# Patient Record
Sex: Female | Born: 1935 | Race: White | Hispanic: No | Marital: Married | Smoking: Never smoker
Health system: Southern US, Community
[De-identification: ages and names within clinical notes are randomized; demographics above are authoritative.]

## PROBLEM LIST (undated history)

## (undated) DIAGNOSIS — R002 Palpitations: Secondary | ICD-10-CM

## (undated) DIAGNOSIS — I2510 Atherosclerotic heart disease of native coronary artery without angina pectoris: Secondary | ICD-10-CM

## (undated) DIAGNOSIS — M8580 Other specified disorders of bone density and structure, unspecified site: Secondary | ICD-10-CM

## (undated) DIAGNOSIS — Z85828 Personal history of other malignant neoplasm of skin: Secondary | ICD-10-CM

## (undated) DIAGNOSIS — K219 Gastro-esophageal reflux disease without esophagitis: Secondary | ICD-10-CM

## (undated) DIAGNOSIS — E7800 Pure hypercholesterolemia, unspecified: Secondary | ICD-10-CM

## (undated) DIAGNOSIS — I499 Cardiac arrhythmia, unspecified: Secondary | ICD-10-CM

## (undated) DIAGNOSIS — Z952 Presence of prosthetic heart valve: Secondary | ICD-10-CM

## (undated) DIAGNOSIS — C801 Malignant (primary) neoplasm, unspecified: Secondary | ICD-10-CM

## (undated) DIAGNOSIS — M810 Age-related osteoporosis without current pathological fracture: Secondary | ICD-10-CM

## (undated) DIAGNOSIS — N939 Abnormal uterine and vaginal bleeding, unspecified: Secondary | ICD-10-CM

## (undated) DIAGNOSIS — R600 Localized edema: Secondary | ICD-10-CM

## (undated) DIAGNOSIS — J45909 Unspecified asthma, uncomplicated: Secondary | ICD-10-CM

## (undated) DIAGNOSIS — I099 Rheumatic heart disease, unspecified: Secondary | ICD-10-CM

## (undated) DIAGNOSIS — I509 Heart failure, unspecified: Secondary | ICD-10-CM

## (undated) DIAGNOSIS — H9190 Unspecified hearing loss, unspecified ear: Secondary | ICD-10-CM

## (undated) DIAGNOSIS — I10 Essential (primary) hypertension: Secondary | ICD-10-CM

## (undated) HISTORY — PX: CERVICAL CONE BIOPSY: SUR198

## (undated) HISTORY — PX: OTHER SURGICAL HISTORY: SHX169

---
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
## 2000-09-29 HISTORY — PX: COLONOSCOPY: SHX174

## 2005-03-31 ENCOUNTER — Ambulatory Visit: Payer: Self-pay | Admitting: Nurse Practitioner

## 2005-09-11 ENCOUNTER — Ambulatory Visit: Payer: Self-pay

## 2006-03-31 ENCOUNTER — Ambulatory Visit: Payer: Self-pay | Admitting: Nurse Practitioner

## 2007-03-05 ENCOUNTER — Ambulatory Visit: Payer: Self-pay | Admitting: Nurse Practitioner

## 2007-05-25 ENCOUNTER — Ambulatory Visit: Payer: Self-pay | Admitting: Family Medicine

## 2008-05-25 ENCOUNTER — Ambulatory Visit: Payer: Self-pay | Admitting: Family Medicine

## 2009-06-12 ENCOUNTER — Ambulatory Visit: Payer: Self-pay | Admitting: Family Medicine

## 2010-06-20 ENCOUNTER — Ambulatory Visit: Payer: Self-pay | Admitting: Family Medicine

## 2011-07-22 ENCOUNTER — Ambulatory Visit: Payer: Self-pay | Admitting: Family Medicine

## 2011-10-07 ENCOUNTER — Ambulatory Visit: Payer: Self-pay | Admitting: Family Medicine

## 2011-11-19 DIAGNOSIS — I10 Essential (primary) hypertension: Secondary | ICD-10-CM | POA: Diagnosis present

## 2011-11-19 DIAGNOSIS — I4891 Unspecified atrial fibrillation: Secondary | ICD-10-CM | POA: Diagnosis present

## 2011-11-19 DIAGNOSIS — I2510 Atherosclerotic heart disease of native coronary artery without angina pectoris: Secondary | ICD-10-CM | POA: Diagnosis present

## 2012-07-22 ENCOUNTER — Ambulatory Visit: Payer: Self-pay | Admitting: Family Medicine

## 2013-07-25 ENCOUNTER — Ambulatory Visit: Payer: Self-pay | Admitting: Family Medicine

## 2014-07-26 ENCOUNTER — Ambulatory Visit: Payer: Self-pay

## 2015-04-10 ENCOUNTER — Encounter
Admission: RE | Admit: 2015-04-10 | Discharge: 2015-04-10 | Disposition: A | Discharge status: Home / Self Care | Payer: Medicare Other | Source: Ambulatory Visit | Attending: Ophthalmology | Admitting: Ophthalmology

## 2015-04-10 ENCOUNTER — Encounter: Payer: Self-pay | Admitting: *Deleted

## 2015-04-10 DIAGNOSIS — Z01812 Encounter for preprocedural laboratory examination: Secondary | ICD-10-CM | POA: Diagnosis present

## 2015-04-10 DIAGNOSIS — H2512 Age-related nuclear cataract, left eye: Secondary | ICD-10-CM | POA: Insufficient documentation

## 2015-04-10 DIAGNOSIS — I10 Essential (primary) hypertension: Secondary | ICD-10-CM | POA: Diagnosis not present

## 2015-04-10 DIAGNOSIS — C4490 Unspecified malignant neoplasm of skin, unspecified: Secondary | ICD-10-CM | POA: Insufficient documentation

## 2015-04-10 DIAGNOSIS — Z01810 Encounter for preprocedural cardiovascular examination: Secondary | ICD-10-CM | POA: Diagnosis present

## 2015-04-10 DIAGNOSIS — E780 Pure hypercholesterolemia: Secondary | ICD-10-CM | POA: Diagnosis not present

## 2015-04-10 DIAGNOSIS — Z952 Presence of prosthetic heart valve: Secondary | ICD-10-CM | POA: Diagnosis not present

## 2015-04-10 DIAGNOSIS — M1991 Primary osteoarthritis, unspecified site: Secondary | ICD-10-CM | POA: Insufficient documentation

## 2015-04-10 DIAGNOSIS — I499 Cardiac arrhythmia, unspecified: Secondary | ICD-10-CM | POA: Diagnosis not present

## 2015-04-10 LAB — POTASSIUM: POTASSIUM: 4.2 mmol/L (ref 3.5–5.1)

## 2015-04-11 NOTE — Pre-Procedure Instructions (Signed)
EKG sent to anesthesia for review.  Reviewed by Dr. Collie Siad, marked OK.

## 2015-04-16 ENCOUNTER — Ambulatory Visit
Admission: RE | Admit: 2015-04-16 | Discharge: 2015-04-16 | Disposition: A | Discharge status: Home / Self Care | Payer: Medicare Other | Source: Ambulatory Visit | Attending: Ophthalmology | Admitting: Ophthalmology

## 2015-04-16 ENCOUNTER — Encounter: Payer: Self-pay | Admitting: Anesthesiology

## 2015-04-16 ENCOUNTER — Encounter
Admission: RE | Disposition: A | Discharge status: Home / Self Care | Payer: Self-pay | Source: Ambulatory Visit | Attending: Ophthalmology

## 2015-04-16 ENCOUNTER — Ambulatory Visit: Payer: Medicare Other | Admitting: Anesthesiology

## 2015-04-16 DIAGNOSIS — H9190 Unspecified hearing loss, unspecified ear: Secondary | ICD-10-CM | POA: Diagnosis not present

## 2015-04-16 DIAGNOSIS — J45909 Unspecified asthma, uncomplicated: Secondary | ICD-10-CM | POA: Diagnosis not present

## 2015-04-16 DIAGNOSIS — I10 Essential (primary) hypertension: Secondary | ICD-10-CM | POA: Diagnosis not present

## 2015-04-16 DIAGNOSIS — M810 Age-related osteoporosis without current pathological fracture: Secondary | ICD-10-CM | POA: Insufficient documentation

## 2015-04-16 DIAGNOSIS — H2512 Age-related nuclear cataract, left eye: Secondary | ICD-10-CM | POA: Diagnosis present

## 2015-04-16 DIAGNOSIS — E780 Pure hypercholesterolemia: Secondary | ICD-10-CM | POA: Diagnosis not present

## 2015-04-16 DIAGNOSIS — Z952 Presence of prosthetic heart valve: Secondary | ICD-10-CM | POA: Diagnosis not present

## 2015-04-16 DIAGNOSIS — K219 Gastro-esophageal reflux disease without esophagitis: Secondary | ICD-10-CM | POA: Diagnosis not present

## 2015-04-16 HISTORY — DX: Essential (primary) hypertension: I10

## 2015-04-16 HISTORY — DX: Unspecified hearing loss, unspecified ear: H91.90

## 2015-04-16 HISTORY — PX: CATARACT EXTRACTION W/PHACO: SHX586

## 2015-04-16 HISTORY — DX: Unspecified asthma, uncomplicated: J45.909

## 2015-04-16 HISTORY — DX: Pure hypercholesterolemia, unspecified: E78.00

## 2015-04-16 HISTORY — DX: Presence of prosthetic heart valve: Z95.2

## 2015-04-16 HISTORY — DX: Malignant (primary) neoplasm, unspecified: C80.1

## 2015-04-16 HISTORY — DX: Cardiac arrhythmia, unspecified: I49.9

## 2015-04-16 HISTORY — DX: Localized edema: R60.0

## 2015-04-16 HISTORY — DX: Gastro-esophageal reflux disease without esophagitis: K21.9

## 2015-04-16 HISTORY — DX: Abnormal uterine and vaginal bleeding, unspecified: N93.9

## 2015-04-16 HISTORY — DX: Age-related osteoporosis without current pathological fracture: M81.0

## 2015-04-16 HISTORY — DX: Palpitations: R00.2

## 2015-04-16 LAB — PROTIME-INR
INR: 2.57
Prothrombin Time: 27.7 seconds — ABNORMAL HIGH (ref 11.4–15.0)

## 2015-04-16 SURGERY — PHACOEMULSIFICATION, CATARACT, WITH IOL INSERTION
Anesthesia: Monitor Anesthesia Care | Site: Eye | Laterality: Left | Wound class: Clean

## 2015-04-16 MED ORDER — PHENYLEPHRINE HCL 10 % OP SOLN
OPHTHALMIC | Status: AC
  Administered 2015-04-16: 1 [drp] via OPHTHALMIC
  Filled 2015-04-16: qty 5

## 2015-04-16 MED ORDER — LACTATED RINGERS IV SOLN: INTRAVENOUS | Status: DC

## 2015-04-16 MED ORDER — NA CHONDROIT SULF-NA HYALURON 40-17 MG/ML IO SOLN
INTRAOCULAR | Status: DC | PRN
  Administered 2015-04-16: 1 mL via INTRAOCULAR

## 2015-04-16 MED ORDER — LIDOCAINE HCL (PF) 4 % IJ SOLN
INTRAMUSCULAR | Status: DC | PRN
  Administered 2015-04-16: 5 mL via OPHTHALMIC

## 2015-04-16 MED ORDER — EPINEPHRINE HCL 1 MG/ML IJ SOLN
INTRAMUSCULAR | Status: AC
  Filled 2015-04-16: qty 2

## 2015-04-16 MED ORDER — NA CHONDROIT SULF-NA HYALURON 40-17 MG/ML IO SOLN
INTRAOCULAR | Status: AC
  Filled 2015-04-16: qty 1

## 2015-04-16 MED ORDER — PROPOFOL INFUSION 10 MG/ML OPTIME: INTRAVENOUS | Status: DC | PRN

## 2015-04-16 MED ORDER — TETRACAINE HCL 0.5 % OP SOLN
OPHTHALMIC | Status: AC
  Filled 2015-04-16: qty 2

## 2015-04-16 MED ORDER — CYCLOPENTOLATE HCL 2 % OP SOLN
1.0000 [drp] | OPHTHALMIC | Status: AC
  Administered 2015-04-16 (×4): 1 [drp] via OPHTHALMIC

## 2015-04-16 MED ORDER — LIDOCAINE HCL (PF) 1 % IJ SOLN
INTRAOCULAR | Status: DC | PRN
  Administered 2015-04-16: 4 mL via OPHTHALMIC

## 2015-04-16 MED ORDER — CEFUROXIME OPHTHALMIC INJECTION 1 MG/0.1 ML
INJECTION | OPHTHALMIC | Status: AC
  Filled 2015-04-16: qty 0.1

## 2015-04-16 MED ORDER — ALFENTANIL 500 MCG/ML IJ INJ
INJECTION | INTRAMUSCULAR | Status: DC | PRN
  Administered 2015-04-16: 1000 ug via INTRAVENOUS

## 2015-04-16 MED ORDER — CEFUROXIME OPHTHALMIC INJECTION 1 MG/0.1 ML
INJECTION | OPHTHALMIC | Status: DC | PRN
  Administered 2015-04-16: 1 mg via INTRACAMERAL

## 2015-04-16 MED ORDER — CYCLOPENTOLATE HCL 2 % OP SOLN
OPHTHALMIC | Status: AC
  Administered 2015-04-16: 1 [drp] via OPHTHALMIC
  Filled 2015-04-16: qty 2

## 2015-04-16 MED ORDER — SODIUM CHLORIDE 0.9 % IV SOLN
INTRAVENOUS | Status: DC
  Administered 2015-04-16: 13:00:00 via INTRAVENOUS

## 2015-04-16 MED ORDER — EPINEPHRINE HCL 1 MG/ML IJ SOLN
INTRAOCULAR | Status: DC | PRN
  Administered 2015-04-16: 200 mL

## 2015-04-16 MED ORDER — TETRACAINE HCL 0.5 % OP SOLN
OPHTHALMIC | Status: DC | PRN
  Administered 2015-04-16: 1 [drp] via OPHTHALMIC

## 2015-04-16 MED ORDER — CARBACHOL 0.01 % IO SOLN
INTRAOCULAR | Status: DC | PRN
  Administered 2015-04-16: 0.5 mL via INTRAOCULAR

## 2015-04-16 MED ORDER — MOXIFLOXACIN HCL 0.5 % OP SOLN
1.0000 [drp] | OPHTHALMIC | Status: AC
  Administered 2015-04-16 (×3): 1 [drp] via OPHTHALMIC

## 2015-04-16 MED ORDER — HYALURONIDASE HUMAN 150 UNIT/ML IJ SOLN
INTRAMUSCULAR | Status: AC
  Filled 2015-04-16: qty 1

## 2015-04-16 MED ORDER — PHENYLEPHRINE HCL 10 % OP SOLN
1.0000 [drp] | OPHTHALMIC | Status: AC
Start: 2015-04-16 — End: 2015-04-16
  Administered 2015-04-16 (×4): 1 [drp] via OPHTHALMIC

## 2015-04-16 MED ORDER — MOXIFLOXACIN HCL 0.5 % OP SOLN
OPHTHALMIC | Status: DC | PRN
  Administered 2015-04-16: 1 [drp] via OPHTHALMIC

## 2015-04-16 MED ORDER — PROPOFOL 10 MG/ML IV BOLUS
INTRAVENOUS | Status: DC | PRN
  Administered 2015-04-16 (×2): 50 mg via INTRAVENOUS

## 2015-04-16 MED ORDER — BUPIVACAINE HCL (PF) 0.75 % IJ SOLN
INTRAMUSCULAR | Status: AC
  Filled 2015-04-16: qty 10

## 2015-04-16 MED ORDER — MOXIFLOXACIN HCL 0.5 % OP SOLN
OPHTHALMIC | Status: AC
  Administered 2015-04-16: 1 [drp] via OPHTHALMIC
  Filled 2015-04-16: qty 3

## 2015-04-16 MED ORDER — LIDOCAINE HCL (PF) 4 % IJ SOLN
INTRAMUSCULAR | Status: AC
  Filled 2015-04-16: qty 5

## 2015-04-16 SURGICAL SUPPLY — 25 items
CORD BIP STRL DISP 12FT (MISCELLANEOUS) ×3 IMPLANT
DRAPE XRAY CASSETTE 23X24 (DRAPES) ×3 IMPLANT
ERASER HMR WETFIELD 18G (MISCELLANEOUS) ×3 IMPLANT
GLOVE BIO SURGEON STRL SZ8 (GLOVE) ×3 IMPLANT
GLOVE SURG LX 6.5 MICRO (GLOVE) ×2
GLOVE SURG LX 8.0 MICRO (GLOVE) ×2
GLOVE SURG LX STRL 6.5 MICRO (GLOVE) ×1 IMPLANT
GLOVE SURG LX STRL 8.0 MICRO (GLOVE) ×1 IMPLANT
GOWN STRL REUS W/ TWL LRG LVL3 (GOWN DISPOSABLE) ×1 IMPLANT
GOWN STRL REUS W/ TWL XL LVL3 (GOWN DISPOSABLE) ×1 IMPLANT
GOWN STRL REUS W/TWL LRG LVL3 (GOWN DISPOSABLE) ×2
GOWN STRL REUS W/TWL XL LVL3 (GOWN DISPOSABLE) ×2
LENS IOL ACRYSOF IQ 20.5 (Intraocular Lens) ×3 IMPLANT
PACK CATARACT (MISCELLANEOUS) ×3 IMPLANT
PACK CATARACT DINGLEDEIN LX (MISCELLANEOUS) ×3 IMPLANT
PACK EYE AFTER SURG (MISCELLANEOUS) ×3 IMPLANT
SHLD EYE VISITEC  UNIV (MISCELLANEOUS) ×3 IMPLANT
SOL PREP PVP 2OZ (MISCELLANEOUS) ×3
SOLUTION PREP PVP 2OZ (MISCELLANEOUS) ×1 IMPLANT
SUT SILK 5-0 (SUTURE) ×3 IMPLANT
SYR 5ML LL (SYRINGE) ×3 IMPLANT
SYR TB 1ML 27GX1/2 LL (SYRINGE) ×3 IMPLANT
UltraSERT ×3 IMPLANT
WATER STERILE IRR 1000ML POUR (IV SOLUTION) ×3 IMPLANT
WIPE NON LINTING 3.25X3.25 (MISCELLANEOUS) ×3 IMPLANT

## 2015-04-16 NOTE — Anesthesia Postprocedure Evaluation (Signed)
  Anesthesia Post-op Note  Patient: Megan Becker  Procedure(s) Performed: Procedure(s) with comments: CATARACT EXTRACTION PHACO AND INTRAOCULAR LENS PLACEMENT (IOC) (Left) - [place]: 00:58 AP:22.7 CDE:23.44 PACK LOT: [id number]  Anesthesia type:MAC  Patient location: PACU  Post pain: Pain level controlled  Post assessment: Post-op Vital signs reviewed, Patient's Cardiovascular Status Stable, Respiratory Function Stable, Patent Airway and No signs of Nausea or vomiting  Post vital signs: Reviewed and stable  Last Vitals:  Filed Vitals:   04/16/15 1422  BP: 129/54  Pulse: 79  Temp:   Resp: 16    Level of consciousness: awake, alert  and patient cooperative  Complications: No apparent anesthesia complications

## 2015-04-16 NOTE — Anesthesia Preprocedure Evaluation (Signed)
Anesthesia Evaluation  Patient identified by MRN, date of birth, ID band Patient awake    Reviewed: Allergy & Precautions, H&P , NPO status , Patient's Chart, lab work & pertinent test results, reviewed documented beta blocker date and time   Airway Mallampati: III  TM Distance: >3 FB Neck ROM: limited    Dental no notable dental hx. (+) Teeth Intact, Caps   Pulmonary asthma ,  breath sounds clear to auscultation  Pulmonary exam normal       Cardiovascular Exercise Tolerance: Good hypertension, Normal cardiovascular exam+ dysrhythmias Rhythm:regular Rate:Normal  Mechanical Mitral Valve   Neuro/Psych negative neurological ROS  negative psych ROS   GI/Hepatic Neg liver ROS, GERD-  Controlled,  Endo/Other  negative endocrine ROS  Renal/GU negative Renal ROS  negative genitourinary   Musculoskeletal   Abdominal   Peds  Hematology negative hematology ROS (+)   Anesthesia Other Findings Past Medical History:   Asthma                                                       Hypertension                                                 Mitral valve replaced                                        Arrhythmia                                                   GERD (gastroesophageal reflux disease)                       HOH (hard of hearing)                                        Hypercholesteremia                                           Osteoporosis                                                 Cancer                                                         Comment:skin   Vaginal bleeding  Palpitations                                                 Lower extremity edema                                        Reproductive/Obstetrics negative OB ROS                             Anesthesia Physical Anesthesia Plan  ASA: III  Anesthesia Plan: MAC    Post-op Pain Management:    Induction:   Airway Management Planned:   Additional Equipment:   Intra-op Plan:   Post-operative Plan:   Informed Consent: I have reviewed the patients History and Physical, chart, labs and discussed the procedure including the risks, benefits and alternatives for the proposed anesthesia with the patient or authorized representative who has indicated his/her understanding and acceptance.   Dental Advisory Given  Plan Discussed with: Anesthesiologist, CRNA and Surgeon  Anesthesia Plan Comments:         Anesthesia Quick Evaluation

## 2015-04-16 NOTE — H&P (Signed)
  History and physical was faxed and scanned in.   

## 2015-04-16 NOTE — Op Note (Signed)
Date of Surgery: 04/16/2015 Date of Dictation: 04/16/2015 1:55 PM Pre-operative Diagnosis:  Nuclear Sclerotic Cataract left Eye Post-operative Diagnosis: same Procedure performed: Extra-capsular Cataract Extraction (ECCE) with placement of a posterior chamber intraocular lens (IOL) left Eye IOL:  Implant Name Type Inv. Item Serial No. Manufacturer Lot No. LRB No. Used  Jearld Lesch     [id number] ALCON   Left 1   Anesthesia: 2% Lidocaine and 4% Marcaine in a 50/50 mixture with 10 unites/ml of Hylenex given as a peribulbar Anesthesiologist: Anesthesiologist: Andria Frames, MD CRNA: Sinda Du, CRNA Complications: none Estimated Blood Loss: less than 1 ml  Description of procedure:  The patient was given anesthesia and sedation via intravenous access. The patient was then prepped and draped in the usual fashion. A 25-gauge needle was bent for initiating the capsulorhexis. A 5-0 silk suture was placed through the conjunctiva superior and inferiorly to serve as bridle sutures. Hemostasis was obtained at the superior limbus using an eraser cautery. A partial thickness groove was made at the anterior surgical limbus with a 64 Beaver blade and this was dissected anteriorly with an [employer]aya. The anterior chamber was entered at 10 o'clock with a 1.0 mm paracentesis knife and through the lamellar dissection with a 2.6 mm Alcon keratome. Epi-Shugarcaine 0.5 CC [9 cc BSS Plus (Alcon), 3 cc 4% preservative-free lidocaine (Hospira) and 4 cc 1:1000 preservative-free, bisulfite-free epinephrine] was injected via the paracentesis tract. DiscoVisc was injected to replace the aqueous and a continuous tear curvilinear capsulorhexis was performed using a bent 25-gauge needle.  Balance salt on a syringe was used to perform hydro-dissection and phacoemulsification was carried out using a divide and conquer technique. Procedure(s) with comments: CATARACT EXTRACTION PHACO AND INTRAOCULAR LENS PLACEMENT (IOC)  (Left) - [place]: 00:58 AP:22.7 CDE:23.44 PACK LOT: [id number] H. Irrigation/aspiration was used to remove the residual cortex and the capsular bag was inflated with DiscoVisc. The intraocular lens was inserted into the capsular bag using a pre-loaded UltraSert Delivery System. Irrigation/aspiration was used to remove the residual DiscoVisc. The wound was inflated with balanced salt and checked for leaks. None were found. Miostat was injected via the paracentesis track and 0.1 ml of cefuroxime containing 1 mg of drug  was injected via the paracentesis track. The wound was checked for leaks again and none were found.   The bridal sutures were removed and two drops of Vigamox were placed on the eye. An eye shield was placed to protect the eye and the patient was discharged to the recovery area in good condition.   Ondine Gemme MD

## 2015-04-16 NOTE — Interval H&P Note (Signed)
History and Physical Interval Note:  04/16/2015 1:02 PM  [place]  has presented today for surgery, with the diagnosis of CATARACT  The various methods of treatment have been discussed with the patient and family. After consideration of risks, benefits and other options for treatment, the patient has consented to  Procedure(s): CATARACT EXTRACTION PHACO AND INTRAOCULAR LENS PLACEMENT ([place]) (Left) as a surgical intervention .  The patient's history has been reviewed, patient examined, no change in status, stable for surgery.  I have reviewed the patient's chart and labs.  Questions were answered to the patient's satisfaction.     Sable Knoles

## 2015-04-16 NOTE — Transfer of Care (Signed)
Immediate Anesthesia Transfer of Care Note  Patient: Megan Becker  Procedure(s) Performed: Procedure(s) with comments: CATARACT EXTRACTION PHACO AND INTRAOCULAR LENS PLACEMENT (IOC) (Left) - [place]: 00:58 AP:22.7 CDE:23.44 PACK LOT: [id number] H  Patient Location: PACU  Anesthesia Type:MAC  Level of Consciousness: awake  Airway & Oxygen Therapy: Patient Spontanous Breathing  Post-op Assessment: Report given to RN  Post vital signs: Reviewed  Last Vitals:  Filed Vitals:   04/16/15 1223  BP: 136/87  Pulse: 86  Temp: 36.7 C  Resp: 16    Complications: No apparent anesthesia complications

## 2015-04-16 NOTE — Discharge Instructions (Addendum)
General Anesthesia, Care After Refer to this sheet in the next few weeks. These instructions provide you with information on caring for yourself after your procedure. Your health care provider may also give you more specific instructions. Your treatment has been planned according to current medical practices, but problems sometimes occur. Call your health care provider if you have any problems or questions after your procedure. WHAT TO EXPECT AFTER THE PROCEDURE After the procedure, it is typical to experience:  Sleepiness.  Nausea and vomiting. HOME CARE INSTRUCTIONS  For the first 24 hours after general anesthesia:  Have a responsible person with you.  Do not drive a car. If you are alone, do not take public transportation.  Do not drink alcohol.  Do not take medicine that has not been prescribed by your health care provider.  Do not sign important papers or make important decisions.  You may resume a normal diet and activities as directed by your health care provider.  Change bandages (dressings) as directed.  If you have questions or problems that seem related to general anesthesia, call the hospital and ask for the anesthetist or anesthesiologist on call. SEEK MEDICAL CARE IF:  You have nausea and vomiting that continue the day after anesthesia.  You develop a rash. SEEK IMMEDIATE MEDICAL CARE IF:   You have difficulty breathing.  You have chest pain.  You have any allergic problems. Document Released: 12/22/2000 Document Revised: 09/20/2013 Document Reviewed: 03/31/2013 [facility] Patient Information 2015 [place], [place]. This information is not intended to replace advice given to you by your health care provider. Make sure you discuss any questions you have with your health care provider.  Cataract Surgery Care After Refer to this sheet in the next few weeks. These instructions provide you with information on caring for yourself after your procedure. Your caregiver  may also give you more specific instructions. Your treatment has been planned according to current medical practices, but problems sometimes occur. Call your caregiver if you have any problems or questions after your procedure.  HOME CARE INSTRUCTIONS   Avoid strenuous activities as directed by your caregiver.  Ask your caregiver when you can resume driving.  Use eyedrops or other medicines to help healing and control pressure inside your eye as directed by your caregiver.  Only take over-the-counter or prescription medicines for pain, discomfort, or fever as directed by your caregiver.  Do not to touch or rub your eyes.  You may be instructed to use a protective shield during the first few days and nights after surgery. If not, wear sunglasses to protect your eyes. This is to protect the eye from pressure or from being accidentally bumped.  Keep the area around your eye clean and dry. Avoid swimming or allowing water to hit you directly in the face while showering. Keep soap and shampoo out of your eyes.  Do not bend or lift heavy objects. Bending increases pressure in the eye. You can walk, climb stairs, and do light household chores.  Do not put a contact lens into the eye that had surgery until your caregiver says it is okay to do so.  Ask your doctor when you can return to work. This will depend on the kind of work that you do. If you work in a dusty environment, you may be advised to wear protective eyewear for a period of time.  Ask your caregiver when it will be safe to engage in sexual activity.  Continue with your regular eye exams as directed  by your caregiver. What to expect:  It is normal to feel itching and mild discomfort for a few days after cataract surgery. Some fluid discharge is also common, and your eye may be sensitive to light and touch.  After 1 to 2 days, even moderate discomfort should disappear. In most cases, healing will take about 6 weeks.  If you  received an intraocular lens (IOL), you may notice that colors are very bright or have a blue tinge. Also, if you have been in bright sunlight, everything may appear reddish for a few hours. If you see these color tinges, it is because your lens is clear and no longer cloudy. Within a few months after receiving an IOL, these extra colors should go away. When you have healed, you will probably need new glasses. SEEK MEDICAL CARE IF:   You have increased bruising around your eye.  You have discomfort not helped by medicine. SEEK IMMEDIATE MEDICAL CARE IF:   You have a fever.  You have a worsening or sudden vision loss.  You have redness, swelling, or increasing pain in the eye.  You have a thick discharge from the eye that had surgery. MAKE SURE YOU:  Understand these instructions.  Will watch your condition.  Will get help right away if you are not doing well or get worse. Document Released: 04/04/2005 Document Revised: 12/08/2011 Document Reviewed: 05/09/2011 [facility] Patient Information 2015 [place], [place]. This information is not intended to replace advice given to you by your health care provider. Make sure you discuss any questions you have with your health care provider.

## 2016-07-30 DIAGNOSIS — Z85828 Personal history of other malignant neoplasm of skin: Secondary | ICD-10-CM

## 2016-07-30 HISTORY — DX: Personal history of other malignant neoplasm of skin: Z85.828

## 2019-06-28 ENCOUNTER — Other Ambulatory Visit: Payer: Self-pay | Admitting: Family Medicine

## 2019-06-28 DIAGNOSIS — M810 Age-related osteoporosis without current pathological fracture: Secondary | ICD-10-CM

## 2019-07-11 ENCOUNTER — Other Ambulatory Visit: Payer: Self-pay | Admitting: Family Medicine

## 2019-07-11 DIAGNOSIS — Z1231 Encounter for screening mammogram for malignant neoplasm of breast: Secondary | ICD-10-CM

## 2019-07-14 ENCOUNTER — Emergency Department
Admission: EM | Admit: 2019-07-14 | Discharge: 2019-07-14 | Disposition: A | Discharge status: Home / Self Care | Payer: Medicare HMO | Attending: Emergency Medicine | Admitting: Emergency Medicine

## 2019-07-14 ENCOUNTER — Encounter: Payer: Self-pay | Admitting: Emergency Medicine

## 2019-07-14 ENCOUNTER — Other Ambulatory Visit: Payer: Self-pay

## 2019-07-14 DIAGNOSIS — Z79899 Other long term (current) drug therapy: Secondary | ICD-10-CM | POA: Diagnosis not present

## 2019-07-14 DIAGNOSIS — Z7982 Long term (current) use of aspirin: Secondary | ICD-10-CM | POA: Diagnosis not present

## 2019-07-14 DIAGNOSIS — Z952 Presence of prosthetic heart valve: Secondary | ICD-10-CM | POA: Insufficient documentation

## 2019-07-14 DIAGNOSIS — I10 Essential (primary) hypertension: Secondary | ICD-10-CM | POA: Insufficient documentation

## 2019-07-14 DIAGNOSIS — R040 Epistaxis: Secondary | ICD-10-CM | POA: Diagnosis not present

## 2019-07-14 DIAGNOSIS — J45909 Unspecified asthma, uncomplicated: Secondary | ICD-10-CM | POA: Insufficient documentation

## 2019-07-14 DIAGNOSIS — Z7901 Long term (current) use of anticoagulants: Secondary | ICD-10-CM | POA: Diagnosis not present

## 2019-07-14 LAB — BASIC METABOLIC PANEL
Anion gap: 11 (ref 5–15)
BUN: 22 mg/dL (ref 8–23)
CO2: 28 mmol/L (ref 22–32)
Calcium: 8.6 mg/dL — ABNORMAL LOW (ref 8.9–10.3)
Chloride: 102 mmol/L (ref 98–111)
Creatinine, Ser: 0.8 mg/dL (ref 0.44–1.00)
GFR calc Af Amer: >60 mL/min (ref >60)
GFR calc non Af Amer: >60 mL/min (ref >60)
Glucose, Bld: 120 mg/dL — ABNORMAL HIGH (ref 70–99)
Potassium: 2.9 mmol/L — ABNORMAL LOW (ref 3.5–5.1)
Sodium: 141 mmol/L (ref 135–145)

## 2019-07-14 LAB — CBC WITH DIFFERENTIAL/PLATELET
Abs Immature Granulocytes: 0.02 10*3/uL (ref 0.00–0.07)
Basophils Absolute: 0.1 10*3/uL (ref 0.0–0.1)
Basophils Relative: 1 %
Eosinophils Absolute: 0.2 10*3/uL (ref 0.0–0.5)
Eosinophils Relative: 2 %
HCT: 43.5 % (ref 36.0–46.0)
Hemoglobin: 14.3 g/dL (ref 12.0–15.0)
Immature Granulocytes: 0 %
Lymphocytes Relative: 23 %
Lymphs Abs: 1.6 10*3/uL (ref 0.7–4.0)
MCH: 32.4 pg (ref 26.0–34.0)
MCHC: 32.9 g/dL (ref 30.0–36.0)
MCV: 98.6 fL (ref 80.0–100.0)
Monocytes Absolute: 1.1 10*3/uL — ABNORMAL HIGH (ref 0.1–1.0)
Monocytes Relative: 15 %
Neutro Abs: 4.1 10*3/uL (ref 1.7–7.7)
Neutrophils Relative %: 59 %
Platelets: 181 10*3/uL (ref 150–400)
RBC: 4.41 MIL/uL (ref 3.87–5.11)
RDW: 11.7 % (ref 11.5–15.5)
WBC: 7 10*3/uL (ref 4.0–10.5)
nRBC: 0 % (ref 0.0–0.2)

## 2019-07-14 LAB — PROTIME-INR
INR: 2.9 — ABNORMAL HIGH (ref 0.8–1.2)
Prothrombin Time: 29.8 seconds — ABNORMAL HIGH (ref 11.4–15.2)

## 2019-07-14 MED ORDER — TRANEXAMIC ACID 1000 MG/10ML IV SOLN
500.0000 mg | Freq: Once | INTRAVENOUS | Status: AC
  Administered 2019-07-14: 500 mg via TOPICAL
  Filled 2019-07-14 (×2): qty 10

## 2019-07-14 MED ORDER — POTASSIUM CHLORIDE ER 10 MEQ PO TBCR: 10.0000 meq | EXTENDED_RELEASE_TABLET | Freq: Every day | ORAL | 0 refills | Status: DC

## 2019-07-14 MED ORDER — AMOXICILLIN-POT CLAVULANATE 875-125 MG PO TABS: 1.0000 | ORAL_TABLET | Freq: Two times a day (BID) | ORAL | 0 refills | Status: AC

## 2019-07-14 MED ORDER — SODIUM CHLORIDE 0.9 % IV BOLUS
1000.0000 mL | Freq: Once | INTRAVENOUS | Status: AC
  Administered 2019-07-14: 1000 mL via INTRAVENOUS

## 2019-07-14 MED ORDER — POTASSIUM CHLORIDE 20 MEQ/15ML (10%) PO SOLN
40.0000 meq | Freq: Once | ORAL | Status: AC
  Administered 2019-07-14: 40 meq via ORAL
  Filled 2019-07-14: qty 30

## 2019-07-14 NOTE — ED Notes (Signed)
Called Pharmacy and they will send up medications

## 2019-07-14 NOTE — ED Provider Notes (Addendum)
[facility] Emergency Department Provider Note   ____________________________________________   First MD Initiated Contact with Patient 07/14/19 [phone]     (approximate)  I have reviewed the triage vital signs and the nursing notes.   HISTORY  Chief Complaint Epistaxis    HPI Megan Becker is a 84 y.o. female with past medical history of chronic atrial fibrillation and mitral valve replacement on Coumadin presents to the ED complaining of epistaxis.  Patient reports that she noticed bleeding coming from both sides of her nose shortly after waking up this morning.  She was unable to control bleeding with pressure at home and states she had blood coming out of front of her nose as well as down the back of her throat.  EMS was called and applied Afrin with no improvement in bleeding.  Patient states she has not had issues with nosebleeds in the past, states her INR was checked yesterday but she is not sure what it was.  She denies any lightheadedness, chest pain, or shortness of breath.        Past Medical History:  Diagnosis Date  . Arrhythmia   . Asthma   . Cancer ([place])    skin  . GERD (gastroesophageal reflux disease)   . HOH (hard of hearing)   . Hx of basal cell carcinoma 07/2016   R zygoma at hair line, R post. auricular neck  . Hypercholesteremia   . Hypertension   . Lower extremity edema   . Mitral valve replaced   . Osteoporosis   . Palpitations   . Vaginal bleeding     There are no active problems to display for this patient.   Past Surgical History:  Procedure Laterality Date  . CATARACT EXTRACTION W/PHACO Left 04/16/2015   Procedure: CATARACT EXTRACTION PHACO AND INTRAOCULAR LENS PLACEMENT (IOC);  Surgeon: Estill Cotta, MD;  Location: [facility] ORS;  Service: Ophthalmology;  Laterality: Left;  [place]: 00:58 AP:22.7 CDE:23.44 PACK LOT: [id number] H  . CERVICAL CONE BIOPSY    . mitral valve replaced      Prior to Admission medications    Medication Sig Start Date End Date Taking? Authorizing Provider  amLODipine (NORVASC) 5 MG tablet Take 5 mg by mouth daily.    Yes [provider]  amoxicillin (AMOXIL) 500 MG tablet Take 2,000 mg by mouth as directed. (1 hour before dental procedures)   Yes [provider]  aspirin 81 MG tablet Take 81 mg by mouth daily.   Yes [provider]  atorvastatin (LIPITOR) 10 MG tablet Take 10 mg by mouth every evening.    Yes [provider]  benazepril (LOTENSIN) 40 MG tablet Take 40 mg by mouth daily.   Yes [provider]  Calcium-Vitamin D (CALTRATE 600 PLUS-VIT D PO) Take 1 tablet by mouth 2 (two) times daily.   Yes [provider]  famotidine (PEPCID) 10 MG tablet Take 10 mg by mouth 2 (two) times daily as needed for heartburn or indigestion.   Yes [provider]  furosemide (LASIX) 20 MG tablet Take 20 mg by mouth daily as needed for fluid.    Yes [provider]  hydrochlorothiazide (HYDRODIURIL) 25 MG tablet Take 25 mg by mouth at bedtime.   Yes [provider]  metoprolol tartrate (LOPRESSOR) 100 MG tablet Take 50-100 mg by mouth See admin instructions. Take 1 tablet (100mg ) by mouth every morning and take  tablet (50mg ) by mouth every evening   Yes [provider]  Multiple Vitamins-Minerals (VISION-VITE PRESERVE PO) Take 1 tablet by mouth 2 (two) times daily.   Yes [provider]  warfarin (COUMADIN) 2 MG tablet Take 2 mg by mouth See admin instructions. Take 1 tablet (2mg ) by mouth every Sunday, Wednesday and Friday evening   Yes [provider]  warfarin (COUMADIN) 3 MG tablet Take 3 mg by mouth See admin instructions. Take 1 tablet (3mg ) by mouth every Tuesday, Thursday and Saturday evening   Yes [provider]  amoxicillin-clavulanate (AUGMENTIN) 875-125 MG tablet Take 1 tablet by mouth 2 (two) times daily for 5 days. 07/14/19 07/19/19  Blake Divine, MD  potassium  chloride (KLOR-CON) 10 MEQ tablet Take 1 tablet (10 mEq total) by mouth daily for 5 days. 07/14/19 07/19/19  Blake Divine, MD    Allergies Patient has no known allergies.  No family history on file.  Social History Social History   Tobacco Use  . Smoking status: Never Smoker  . Smokeless tobacco: Never Used  Substance Use Topics  . Alcohol use: No  . Drug use: Never    Review of Systems  Constitutional: No fever/chills Eyes: No visual changes. ENT: No sore throat.  Positive for epistaxis. Cardiovascular: Denies chest pain. Respiratory: Denies shortness of breath. Gastrointestinal: No abdominal pain.  No nausea, no vomiting.  No diarrhea.  No constipation. Genitourinary: Negative for dysuria. Musculoskeletal: Negative for back pain. Skin: Negative for rash. Neurological: Negative for headaches, focal weakness or numbness.  ____________________________________________   PHYSICAL EXAM:  VITAL SIGNS: ED Triage Vitals  Enc Vitals Group     BP 07/14/19 0838 (!) 152/92     Pulse Rate 07/14/19 0837 79     Resp 07/14/19 0837 16     Temp 07/14/19 0840 97.9 F (36.6 C)     Temp Source 07/14/19 0840 Oral     SpO2 07/14/19 0837 99 %     Weight --      Height --      Head Circumference --      Peak Flow --      Pain Score 07/14/19 0837 0     Pain Loc --      Pain Edu? --      Excl. in [place]? --     Constitutional: Alert and oriented. Eyes: Conjunctivae are normal. Head: Atraumatic. Nose: Pressure clamp in place, blood noted to both naris with no active bleeding. Mouth/Throat: Mucous membranes are moist.  Scant blood noted in posterior oropharynx. Neck: Normal ROM Cardiovascular: Normal rate, regular rhythm. Grossly normal heart sounds. Respiratory: Normal respiratory effort.  No retractions. Lungs CTAB. Gastrointestinal: Soft and nontender. No distention. Genitourinary: deferred Musculoskeletal: No lower extremity tenderness nor edema. Neurologic:  Normal speech  and language. No gross focal neurologic deficits are appreciated. Skin:  Skin is warm, dry and intact. No rash noted. Psychiatric: Mood and affect are normal. Speech and behavior are normal.  ____________________________________________   LABS (all labs ordered are listed, but only abnormal results are displayed)  Labs Reviewed  CBC WITH DIFFERENTIAL/PLATELET - Abnormal; Notable for the following components:      Result Value   Monocytes Absolute 1.1 (*)    All other components within normal limits  BASIC METABOLIC PANEL - Abnormal; Notable for the following components:   Potassium 2.9 (*)    Glucose, Bld 120 (*)    Calcium 8.6 (*)    All other components within normal limits  PROTIME-INR - Abnormal; Notable for the following components:   Prothrombin Time  29.8 (*)    INR 2.9 (*)    All other components within normal limits   ED ECG REPORT I, Blake Divine, the attending physician, personally viewed and interpreted this ECG.   Date: 07/14/2019  EKG Time: 11:54  Rate: 72  Rhythm: atrial fibrillation, rate 72  Axis: RAD  Intervals:none  ST&T Change: None   PROCEDURES  Procedure(s) performed (including Critical Care):  .Critical Care Performed by: Blake Divine, MD Authorized by: Blake Divine, MD   Critical care provider statement:    Critical care time (minutes):  45   Critical care time was exclusive of:  Separately billable procedures and treating other patients and teaching time   Critical care was necessary to treat or prevent imminent or life-threatening deterioration of the following conditions: Epistaxis.   Critical care was time spent personally by me on the following activities:  Discussions with consultants, evaluation of patient's response to treatment, examination of patient, ordering and performing treatments and interventions, ordering and review of laboratory studies, ordering and review of radiographic studies, pulse oximetry, re-evaluation of  patient's condition, obtaining history from patient or surrogate and review of old charts   I assumed direction of critical care for this patient from another provider in my specialty: no   .Epistaxis Management  Date/Time: 07/14/2019 3:05 PM Performed by: Blake Divine, MD Authorized by: Blake Divine, MD   Anesthesia (see MAR for exact dosages):    Anesthesia method:  None Procedure details:    Treatment site:  R anterior   Treatment method:  Anterior pack   Treatment complexity:  Limited   Treatment episode: initial   Post-procedure details:    Assessment:  Bleeding stopped   Patient tolerance of procedure:  Tolerated well, no immediate complications Comments:     Initially treated with cotton pledgets soaked with TXA with minimal improvement in bleeding.  Subsequently treated with anterior Rhino Rocket on the right with cessation of bleeding.     ____________________________________________   INITIAL IMPRESSION / ASSESSMENT AND PLAN / ED COURSE       84 year old female on Coumadin for A. fib and mitral valve replacement presents to the ED complaining of nosebleed starting this morning.  This did not improve with pressure at home or Afrin applied by EMS, however pressure clamp applied upon patient's arrival to the ED with cessation of bleeding.  She no longer feels blood coming down the back of her throat and no bleeding noted anteriorly.  Will leave clamp in place for now and continue to monitor.  Check CBC, BMP, INR.  Labs unremarkable, INR therapeutic at 2.9.  Will attempt to control bleeding prior to reversing Coumadin.  Patient was initially treated with Afrin and pressure with no cessation of bleeding, subsequently had cotton pledget soaked in TXA placed which again was unsuccessful in controlling bleeding.  Anterior Rhino Rocket was subsequently placed in right nare with cessation of bleeding.  Unfortunately, patient had what appeared to be a vasovagal episode shortly  after placement of Rhino Rocket.  Balloon was deflated slightly and EKG was unremarkable, patient had gradual improvement in symptoms following IV fluid bolus.  Case discussed with Dr. Pryor Ochoa of ENT, who recommends following up in the office in 4 days for removal of Rhino Rocket, will place patient on Augmentin in the meantime.  Counseled patient to return to the ED for new or worsening symptoms, patient agrees with plan.      ____________________________________________   FINAL CLINICAL IMPRESSION(S) / ED DIAGNOSES  Final  diagnoses:  Anterior epistaxis  Anticoagulated on Coumadin     ED Discharge Orders         Ordered    amoxicillin-clavulanate (AUGMENTIN) 875-125 MG tablet  2 times daily     07/14/19 1239    potassium chloride (KLOR-CON) 10 MEQ tablet  Daily     07/14/19 1239           Note:  This document was prepared using Dragon voice recognition software and may include unintentional dictation errors.   Blake Divine, MD 07/14/19 [place]    Blake Divine, MD 07/20/19 1253

## 2019-07-14 NOTE — ED Notes (Addendum)
This RN called to bedside to help pt use restroom. PT pale and diaphoretic upon arrival to room, pt sleepy and stating she doesn't feel right. MD Charna Archer called to bedside, air deflated out of nasal tubing. IVF started

## 2019-07-14 NOTE — ED Notes (Addendum)
[place] RN and EDP [place] at bedside. Pt having vagal episode. Pale and clammy.

## 2019-07-14 NOTE — ED Triage Notes (Signed)
Pt from home via EMS with c/o nosebleed. PT is on anticoags. PT was given 2 sprays of afrin in route. PT still bleeding at this time. MD at bedside. NAD noted

## 2019-07-14 NOTE — ED Notes (Signed)
Pt using bedside toilet. Wheeled out to lobby.

## 2019-07-14 NOTE — ED Notes (Signed)
EDP [place] at bedside 

## 2019-07-14 NOTE — ED Notes (Signed)
Pt feels like a "clot of blood is in the back of her throat." Sat up in bed and educated to lean head forwards so as not to allow any blood to continue going down throat. Nose not currently bleeding. Pt in NAD. Sats appropriate. Family remains at bedside.

## 2019-07-14 NOTE — ED Notes (Signed)
Nose clamp applied. Per MD take off in 68min

## 2019-07-14 NOTE — ED Notes (Signed)
Pt more relaxed, color back in face, states starting to feel better. Pharm tech at bedside reviewing meds with pt/family. Pt states still wants to use restroom but agrees with this RN that waiting a few more mins is best. Call bell within reach. Family remains at bedside. Bed locked low. Rail up.

## 2019-08-09 ENCOUNTER — Ambulatory Visit
Admission: RE | Admit: 2019-08-09 | Discharge: 2019-08-09 | Disposition: A | Discharge status: Home / Self Care | Payer: Medicare HMO | Source: Ambulatory Visit | Attending: Family Medicine | Admitting: Family Medicine

## 2019-08-09 DIAGNOSIS — M810 Age-related osteoporosis without current pathological fracture: Secondary | ICD-10-CM | POA: Diagnosis not present

## 2020-08-27 ENCOUNTER — Encounter: Payer: Medicare HMO | Admitting: Dermatology

## 2020-09-07 DIAGNOSIS — Z952 Presence of prosthetic heart valve: Secondary | ICD-10-CM

## 2020-09-07 DIAGNOSIS — I5032 Chronic diastolic (congestive) heart failure: Secondary | ICD-10-CM | POA: Diagnosis present

## 2020-09-10 ENCOUNTER — Emergency Department
Admission: EM | Admit: 2020-09-10 | Discharge: 2020-09-10 | Disposition: A | Discharge status: Home / Self Care | Payer: Medicare HMO | Attending: Emergency Medicine | Admitting: Emergency Medicine

## 2020-09-10 ENCOUNTER — Other Ambulatory Visit: Payer: Self-pay

## 2020-09-10 DIAGNOSIS — J45909 Unspecified asthma, uncomplicated: Secondary | ICD-10-CM | POA: Insufficient documentation

## 2020-09-10 DIAGNOSIS — Z85828 Personal history of other malignant neoplasm of skin: Secondary | ICD-10-CM | POA: Insufficient documentation

## 2020-09-10 DIAGNOSIS — Z7982 Long term (current) use of aspirin: Secondary | ICD-10-CM | POA: Diagnosis not present

## 2020-09-10 DIAGNOSIS — I10 Essential (primary) hypertension: Secondary | ICD-10-CM | POA: Insufficient documentation

## 2020-09-10 DIAGNOSIS — Z7901 Long term (current) use of anticoagulants: Secondary | ICD-10-CM | POA: Insufficient documentation

## 2020-09-10 DIAGNOSIS — R638 Other symptoms and signs concerning food and fluid intake: Secondary | ICD-10-CM | POA: Insufficient documentation

## 2020-09-10 DIAGNOSIS — R0602 Shortness of breath: Secondary | ICD-10-CM | POA: Diagnosis present

## 2020-09-10 DIAGNOSIS — R531 Weakness: Secondary | ICD-10-CM

## 2020-09-10 DIAGNOSIS — Z79899 Other long term (current) drug therapy: Secondary | ICD-10-CM | POA: Diagnosis not present

## 2020-09-10 LAB — URINALYSIS, COMPLETE (UACMP) WITH MICROSCOPIC
Bilirubin Urine: NEGATIVE
Glucose, UA: NEGATIVE mg/dL
Hgb urine dipstick: NEGATIVE
Ketones, ur: NEGATIVE mg/dL
Leukocytes,Ua: NEGATIVE
Nitrite: NEGATIVE
Protein, ur: NEGATIVE mg/dL
Specific Gravity, Urine: 1.011 (ref 1.005–1.030)
pH: 5 (ref 5.0–8.0)

## 2020-09-10 LAB — CBC
HCT: 41.7 % (ref 36.0–46.0)
Hemoglobin: 14.4 g/dL (ref 12.0–15.0)
MCH: 34.2 pg — ABNORMAL HIGH (ref 26.0–34.0)
MCHC: 34.5 g/dL (ref 30.0–36.0)
MCV: 99 fL (ref 80.0–100.0)
Platelets: 217 10*3/uL (ref 150–400)
RBC: 4.21 MIL/uL (ref 3.87–5.11)
RDW: 12.9 % (ref 11.5–15.5)
WBC: 10.4 10*3/uL (ref 4.0–10.5)
nRBC: 0 % (ref 0.0–0.2)

## 2020-09-10 LAB — BASIC METABOLIC PANEL
Anion gap: 13 (ref 5–15)
BUN: 20 mg/dL (ref 8–23)
CO2: 29 mmol/L (ref 22–32)
Calcium: 9.7 mg/dL (ref 8.9–10.3)
Chloride: 93 mmol/L — ABNORMAL LOW (ref 98–111)
Creatinine, Ser: 1.25 mg/dL — ABNORMAL HIGH (ref 0.44–1.00)
GFR, Estimated: 43 mL/min — ABNORMAL LOW (ref >60)
Glucose, Bld: 136 mg/dL — ABNORMAL HIGH (ref 70–99)
Potassium: 3.4 mmol/L — ABNORMAL LOW (ref 3.5–5.1)
Sodium: 135 mmol/L (ref 135–145)

## 2020-09-10 LAB — PROTIME-INR
INR: 3 — ABNORMAL HIGH (ref 0.8–1.2)
Prothrombin Time: 30 seconds — ABNORMAL HIGH (ref 11.4–15.2)

## 2020-09-10 NOTE — ED Triage Notes (Signed)
First Nurse Note:  Arrives with multiple complaints.  Sob, back pain, elevated INR, poor appetite x 1 month.  Sent to ED by primary care for evaluation.  Patient is AAOx3.  Skin warm and NAD

## 2020-09-10 NOTE — ED Triage Notes (Signed)
Pt sent from PCP with c/o loss of appetite with nausea for the past month. Was recently treated for UTI with lower back pain. Pt is a/ox4.

## 2020-09-10 NOTE — Discharge Instructions (Addendum)
Please seek medical attention for any high fevers, chest pain, shortness of breath, change in behavior, persistent vomiting, bloody stool or any other new or concerning symptoms.  

## 2020-09-10 NOTE — ED Provider Notes (Signed)
[facility] Emergency Department Provider Note   ____________________________________________   I have reviewed the triage vital signs and the nursing notes.   HISTORY  Chief Complaint Shortness of breath, weakness, decreased oral intake  History limited by: Not Limited, however most history obtained from daughter at bedside   HPI Megan Becker is a 85 y.o. female who presents to the emergency department today accompanied by daughter because of concerns for shortness of breath, decreased exercise tolerance and decreased oral intake.  The daughter is the primary history giver.  She states she has noticed that the patient has been having worsening shortness of breath and weakness for the past couple of months.  Additionally she has had 12 pound weight loss since August.  The patient apparently is having a hard time swallowing and feels like things are getting stuck in her esophagus.  It appears they came in today because they were instructed to by the primary care doctor because they were told they could get testing done quicker then they could as an outpatient.    Records reviewed. Per medical record review patient has a history of HTN, GERD, atrial fibrillation.   Past Medical History:  Diagnosis Date  . Arrhythmia   . Asthma   . Cancer ([place])    skin  . GERD (gastroesophageal reflux disease)   . HOH (hard of hearing)   . Hx of basal cell carcinoma 07/2016   R zygoma at hair line, R post. auricular neck  . Hypercholesteremia   . Hypertension   . Lower extremity edema   . Mitral valve replaced   . Osteoporosis   . Palpitations   . Vaginal bleeding     There are no problems to display for this patient.   Past Surgical History:  Procedure Laterality Date  . CATARACT EXTRACTION W/PHACO Left 04/16/2015   Procedure: CATARACT EXTRACTION PHACO AND INTRAOCULAR LENS PLACEMENT (IOC);  Surgeon: Estill Cotta, MD;  Location: [facility] ORS;  Service: Ophthalmology;   Laterality: Left;  [place]: 00:58 AP:22.7 CDE:23.44 PACK LOT: [id number] H  . CERVICAL CONE BIOPSY    . mitral valve replaced      Prior to Admission medications   Medication Sig Start Date End Date Taking? Authorizing Provider  amLODipine (NORVASC) 5 MG tablet Take 5 mg by mouth daily.     [provider]  amoxicillin (AMOXIL) 500 MG tablet Take 2,000 mg by mouth as directed. (1 hour before dental procedures)    [provider]  aspirin 81 MG tablet Take 81 mg by mouth daily.    [provider]  atorvastatin (LIPITOR) 10 MG tablet Take 10 mg by mouth every evening.     [provider]  benazepril (LOTENSIN) 40 MG tablet Take 40 mg by mouth daily.    [provider]  Calcium-Vitamin D (CALTRATE 600 PLUS-VIT D PO) Take 1 tablet by mouth 2 (two) times daily.    [provider]  famotidine (PEPCID) 10 MG tablet Take 10 mg by mouth 2 (two) times daily as needed for heartburn or indigestion.    [provider]  furosemide (LASIX) 20 MG tablet Take 20 mg by mouth daily as needed for fluid.     [provider]  hydrochlorothiazide (HYDRODIURIL) 25 MG tablet Take 25 mg by mouth at bedtime.    [provider]  metoprolol tartrate (LOPRESSOR) 100 MG tablet Take 50-100 mg by mouth See admin instructions. Take 1 tablet (100mg ) by mouth every morning and take  tablet (50mg ) by mouth every evening    [provider]  Multiple Vitamins-Minerals (VISION-VITE PRESERVE PO) Take 1 tablet by mouth 2 (two) times daily.    [provider]  potassium chloride (KLOR-CON) 10 MEQ tablet Take 1 tablet (10 mEq total) by mouth daily for 5 days. 07/14/19 07/19/19  Blake Divine, MD  warfarin (COUMADIN) 2 MG tablet Take 2 mg by mouth See admin instructions. Take 1 tablet (2mg ) by mouth every Sunday, Wednesday and Friday evening    [provider]  warfarin (COUMADIN) 3 MG tablet Take 3 mg by mouth See admin instructions.  Take 1 tablet (3mg ) by mouth every Tuesday, Thursday and Saturday evening    [provider]    Allergies Patient has no known allergies.  No family history on file.  Social History Social History   Tobacco Use  . Smoking status: Never Smoker  . Smokeless tobacco: Never Used  Substance Use Topics  . Alcohol use: No  . Drug use: Never    Review of Systems Constitutional: No fever/chills Eyes: No visual changes. ENT: No sore throat. Cardiovascular: Denies chest pain. Respiratory: Positive for shortness of breath. Gastrointestinal: Positive for decreased oral intake.  Genitourinary: Negative for dysuria. Musculoskeletal: Negative for back pain. Skin: Negative for rash. Neurological: Negative for headaches, focal weakness or numbness.  ____________________________________________   PHYSICAL EXAM:  VITAL SIGNS: ED Triage Vitals  Enc Vitals Group     BP 09/10/20 1336 140/81     Pulse Rate 09/10/20 1336 95     Resp 09/10/20 1336 17     Temp 09/10/20 1336 98.1 F (36.7 C)     Temp Source 09/10/20 1336 Oral     SpO2 09/10/20 1336 97 %     Weight 09/10/20 1337 152 lb (68.9 kg)     Height 09/10/20 1337 5\' 3"  (1.6 m)     Head Circumference --      Peak Flow --      Pain Score 09/10/20 1337 0   Constitutional: Alert and oriented.  Eyes: Conjunctivae are normal.  ENT      Head: Normocephalic and atraumatic.      Nose: No congestion/rhinnorhea.      Mouth/Throat: Mucous membranes are moist.      Neck: No stridor. Hematological/Lymphatic/Immunilogical: No cervical lymphadenopathy. Cardiovascular: Normal rate, regular rhythm.  No murmurs, rubs, or gallops.  Respiratory: Normal respiratory effort without tachypnea nor retractions. Breath sounds are clear and equal bilaterally. No wheezes/rales/rhonchi. Gastrointestinal: Soft and non tender. No rebound. No guarding.  Genitourinary: Deferred Musculoskeletal: Normal range of motion in all extremities. No lower  extremity edema. Neurologic:  Normal speech and language. No gross focal neurologic deficits are appreciated.  Skin:  Skin is warm, dry and intact. No rash noted. Psychiatric: Mood and affect are normal. Speech and behavior are normal. Patient exhibits appropriate insight and judgment.  ____________________________________________    LABS (pertinent positives/negatives)  INR 3.0 CBC wbc 10.4, hgb 14.4, plt 217 BMP na 135, k 3.4, glu 136, cr 1.25 UA hazy, negative nitrite and leukocytes, 0-5 rbc and wbc ____________________________________________   EKG  I, Nance Pear, attending physician, personally viewed and interpreted this EKG  EKG Time: 1330 Rate: 100 Rhythm: atrial fibrillation Axis: rightward axis Intervals: qtc 505 QRS: Incomplete LBBB ST changes: no st elevation Impression: abnormal ekg  ____________________________________________    RADIOLOGY  None  ____________________________________________   PROCEDURES  Procedures  ____________________________________________   INITIAL IMPRESSION / ASSESSMENT AND PLAN / ED COURSE  Pertinent labs & imaging results that were available during my care of the patient were reviewed by me and considered in my medical decision making (see chart for details).   Patient presented to the emergency department today accompanied by daughter because of concerns for weakness, shortness of breath and decreased oral intake.  It does appear that the symptoms have been going on at least for 1 month if not for a few months.  Somewhat unclear to me why PCP thought the patient needed an emergency room evaluation at this time.  Does not appear that there was any acute changes.  Blood work here without significant abnormality.  Slight elevation of creatinine over paperwork that family had at bedside.  I did discuss this and do think she is slightly dehydrated.  Encouraged oral intake.  In terms of her difficulty with swallowing I do  think GI eval would be most appropriate.  Did discuss possibility of the EGD and motility studies.  Initially patient has seen cardiology already for the shortness of breath and weakness.  Did discuss continued follow-up with outpatient provider for further work-up be appropriate at this time.  Patient was ambulated here without any desaturation.   ____________________________________________   FINAL CLINICAL IMPRESSION(S) / ED DIAGNOSES  Final diagnoses:  Weakness  Decreased oral intake     Note: This dictation was prepared with Dragon dictation. Any transcriptional errors that result from this process are unintentional     Nance Pear, MD 09/10/20 2134

## 2020-09-19 ENCOUNTER — Other Ambulatory Visit: Payer: Self-pay | Admitting: Gastroenterology

## 2020-09-19 DIAGNOSIS — R634 Abnormal weight loss: Secondary | ICD-10-CM

## 2020-09-19 DIAGNOSIS — R1084 Generalized abdominal pain: Secondary | ICD-10-CM

## 2020-09-19 DIAGNOSIS — R1319 Other dysphagia: Secondary | ICD-10-CM

## 2020-09-19 DIAGNOSIS — R0602 Shortness of breath: Secondary | ICD-10-CM

## 2020-09-19 DIAGNOSIS — R110 Nausea: Secondary | ICD-10-CM

## 2020-09-27 ENCOUNTER — Ambulatory Visit: Payer: Medicare HMO

## 2020-10-01 ENCOUNTER — Other Ambulatory Visit: Payer: Self-pay

## 2020-10-01 ENCOUNTER — Other Ambulatory Visit
Admission: RE | Admit: 2020-10-01 | Discharge: 2020-10-01 | Disposition: A | Discharge status: Home / Self Care | Payer: Medicare HMO | Source: Ambulatory Visit | Attending: Internal Medicine | Admitting: Internal Medicine

## 2020-10-01 DIAGNOSIS — A419 Sepsis, unspecified organism: Secondary | ICD-10-CM | POA: Diagnosis not present

## 2020-10-01 DIAGNOSIS — E222 Syndrome of inappropriate secretion of antidiuretic hormone: Secondary | ICD-10-CM | POA: Diagnosis not present

## 2020-10-01 DIAGNOSIS — Z20822 Contact with and (suspected) exposure to covid-19: Secondary | ICD-10-CM | POA: Insufficient documentation

## 2020-10-01 DIAGNOSIS — Z01812 Encounter for preprocedural laboratory examination: Secondary | ICD-10-CM | POA: Insufficient documentation

## 2020-10-01 LAB — SARS CORONAVIRUS 2 (TAT 6-24 HRS): SARS Coronavirus 2: NEGATIVE

## 2020-10-02 ENCOUNTER — Emergency Department: Payer: Medicare HMO

## 2020-10-02 ENCOUNTER — Inpatient Hospital Stay
Admission: EM | Admit: 2020-10-02 | Discharge: 2020-10-06 | DRG: 871 | Disposition: A | Discharge status: Hospice | Payer: Medicare HMO | Attending: Hospitalist | Admitting: Hospitalist

## 2020-10-02 ENCOUNTER — Telehealth: Payer: Self-pay | Admitting: Internal Medicine

## 2020-10-02 ENCOUNTER — Encounter: Payer: Self-pay | Admitting: Internal Medicine

## 2020-10-02 ENCOUNTER — Other Ambulatory Visit: Payer: Self-pay

## 2020-10-02 DIAGNOSIS — C259 Malignant neoplasm of pancreas, unspecified: Secondary | ICD-10-CM | POA: Diagnosis present

## 2020-10-02 DIAGNOSIS — C799 Secondary malignant neoplasm of unspecified site: Secondary | ICD-10-CM

## 2020-10-02 DIAGNOSIS — I4891 Unspecified atrial fibrillation: Secondary | ICD-10-CM | POA: Diagnosis present

## 2020-10-02 DIAGNOSIS — R590 Localized enlarged lymph nodes: Secondary | ICD-10-CM | POA: Diagnosis not present

## 2020-10-02 DIAGNOSIS — I2510 Atherosclerotic heart disease of native coronary artery without angina pectoris: Secondary | ICD-10-CM | POA: Diagnosis present

## 2020-10-02 DIAGNOSIS — E876 Hypokalemia: Secondary | ICD-10-CM | POA: Diagnosis present

## 2020-10-02 DIAGNOSIS — I099 Rheumatic heart disease, unspecified: Secondary | ICD-10-CM | POA: Diagnosis present

## 2020-10-02 DIAGNOSIS — I110 Hypertensive heart disease with heart failure: Secondary | ICD-10-CM | POA: Diagnosis present

## 2020-10-02 DIAGNOSIS — R791 Abnormal coagulation profile: Secondary | ICD-10-CM | POA: Diagnosis present

## 2020-10-02 DIAGNOSIS — M810 Age-related osteoporosis without current pathological fracture: Secondary | ICD-10-CM | POA: Diagnosis present

## 2020-10-02 DIAGNOSIS — N170 Acute kidney failure with tubular necrosis: Secondary | ICD-10-CM

## 2020-10-02 DIAGNOSIS — E861 Hypovolemia: Secondary | ICD-10-CM | POA: Diagnosis present

## 2020-10-02 DIAGNOSIS — Z20822 Contact with and (suspected) exposure to covid-19: Secondary | ICD-10-CM | POA: Diagnosis present

## 2020-10-02 DIAGNOSIS — M546 Pain in thoracic spine: Secondary | ICD-10-CM | POA: Diagnosis present

## 2020-10-02 DIAGNOSIS — R000 Tachycardia, unspecified: Secondary | ICD-10-CM | POA: Diagnosis present

## 2020-10-02 DIAGNOSIS — E7800 Pure hypercholesterolemia, unspecified: Secondary | ICD-10-CM | POA: Diagnosis present

## 2020-10-02 DIAGNOSIS — C7800 Secondary malignant neoplasm of unspecified lung: Secondary | ICD-10-CM | POA: Diagnosis present

## 2020-10-02 DIAGNOSIS — R54 Age-related physical debility: Secondary | ICD-10-CM | POA: Diagnosis present

## 2020-10-02 DIAGNOSIS — Z7901 Long term (current) use of anticoagulants: Secondary | ICD-10-CM

## 2020-10-02 DIAGNOSIS — C7801 Secondary malignant neoplasm of right lung: Secondary | ICD-10-CM | POA: Diagnosis not present

## 2020-10-02 DIAGNOSIS — C801 Malignant (primary) neoplasm, unspecified: Secondary | ICD-10-CM | POA: Diagnosis not present

## 2020-10-02 DIAGNOSIS — Z79899 Other long term (current) drug therapy: Secondary | ICD-10-CM

## 2020-10-02 DIAGNOSIS — R634 Abnormal weight loss: Secondary | ICD-10-CM | POA: Diagnosis not present

## 2020-10-02 DIAGNOSIS — K5730 Diverticulosis of large intestine without perforation or abscess without bleeding: Secondary | ICD-10-CM | POA: Diagnosis present

## 2020-10-02 DIAGNOSIS — Z515 Encounter for palliative care: Secondary | ICD-10-CM | POA: Diagnosis not present

## 2020-10-02 DIAGNOSIS — C786 Secondary malignant neoplasm of retroperitoneum and peritoneum: Secondary | ICD-10-CM | POA: Diagnosis not present

## 2020-10-02 DIAGNOSIS — I10 Essential (primary) hypertension: Secondary | ICD-10-CM | POA: Diagnosis present

## 2020-10-02 DIAGNOSIS — C7802 Secondary malignant neoplasm of left lung: Secondary | ICD-10-CM | POA: Diagnosis not present

## 2020-10-02 DIAGNOSIS — Z66 Do not resuscitate: Secondary | ICD-10-CM | POA: Diagnosis present

## 2020-10-02 DIAGNOSIS — A419 Sepsis, unspecified organism: Principal | ICD-10-CM | POA: Diagnosis present

## 2020-10-02 DIAGNOSIS — K8689 Other specified diseases of pancreas: Secondary | ICD-10-CM

## 2020-10-02 DIAGNOSIS — J989 Respiratory disorder, unspecified: Secondary | ICD-10-CM

## 2020-10-02 DIAGNOSIS — Z952 Presence of prosthetic heart valve: Secondary | ICD-10-CM

## 2020-10-02 DIAGNOSIS — J45909 Unspecified asthma, uncomplicated: Secondary | ICD-10-CM | POA: Diagnosis present

## 2020-10-02 DIAGNOSIS — R630 Anorexia: Secondary | ICD-10-CM | POA: Diagnosis not present

## 2020-10-02 DIAGNOSIS — R188 Other ascites: Secondary | ICD-10-CM | POA: Diagnosis present

## 2020-10-02 DIAGNOSIS — I5032 Chronic diastolic (congestive) heart failure: Secondary | ICD-10-CM | POA: Diagnosis present

## 2020-10-02 DIAGNOSIS — Z85828 Personal history of other malignant neoplasm of skin: Secondary | ICD-10-CM

## 2020-10-02 DIAGNOSIS — E222 Syndrome of inappropriate secretion of antidiuretic hormone: Secondary | ICD-10-CM | POA: Diagnosis present

## 2020-10-02 DIAGNOSIS — E872 Acidosis, unspecified: Secondary | ICD-10-CM

## 2020-10-02 DIAGNOSIS — N179 Acute kidney failure, unspecified: Secondary | ICD-10-CM | POA: Diagnosis present

## 2020-10-02 DIAGNOSIS — D72829 Elevated white blood cell count, unspecified: Secondary | ICD-10-CM

## 2020-10-02 DIAGNOSIS — R6520 Severe sepsis without septic shock: Secondary | ICD-10-CM

## 2020-10-02 DIAGNOSIS — K219 Gastro-esophageal reflux disease without esophagitis: Secondary | ICD-10-CM | POA: Diagnosis present

## 2020-10-02 DIAGNOSIS — J189 Pneumonia, unspecified organism: Secondary | ICD-10-CM | POA: Diagnosis present

## 2020-10-02 DIAGNOSIS — H9190 Unspecified hearing loss, unspecified ear: Secondary | ICD-10-CM | POA: Diagnosis present

## 2020-10-02 DIAGNOSIS — E871 Hypo-osmolality and hyponatremia: Secondary | ICD-10-CM

## 2020-10-02 DIAGNOSIS — R197 Diarrhea, unspecified: Secondary | ICD-10-CM | POA: Diagnosis present

## 2020-10-02 DIAGNOSIS — R531 Weakness: Secondary | ICD-10-CM | POA: Diagnosis not present

## 2020-10-02 DIAGNOSIS — Z7982 Long term (current) use of aspirin: Secondary | ICD-10-CM

## 2020-10-02 DIAGNOSIS — R109 Unspecified abdominal pain: Secondary | ICD-10-CM

## 2020-10-02 LAB — COMPREHENSIVE METABOLIC PANEL
ALT: 20 U/L (ref 0–44)
AST: 35 U/L (ref 15–41)
Albumin: 3.5 g/dL (ref 3.5–5.0)
Alkaline Phosphatase: 123 U/L (ref 38–126)
Anion gap: 16 — ABNORMAL HIGH (ref 5–15)
BUN: 16 mg/dL (ref 8–23)
CO2: 27 mmol/L (ref 22–32)
Calcium: 8.3 mg/dL — ABNORMAL LOW (ref 8.9–10.3)
Chloride: 81 mmol/L — ABNORMAL LOW (ref 98–111)
Creatinine, Ser: 1.65 mg/dL — ABNORMAL HIGH (ref 0.44–1.00)
GFR, Estimated: 31 mL/min — ABNORMAL LOW (ref >60)
Glucose, Bld: 179 mg/dL — ABNORMAL HIGH (ref 70–99)
Potassium: 2.9 mmol/L — ABNORMAL LOW (ref 3.5–5.1)
Sodium: 124 mmol/L — ABNORMAL LOW (ref 135–145)
Total Bilirubin: 2.3 mg/dL — ABNORMAL HIGH (ref 0.3–1.2)
Total Protein: 6.5 g/dL (ref 6.5–8.1)

## 2020-10-02 LAB — CBC WITH DIFFERENTIAL/PLATELET
Abs Immature Granulocytes: 0.26 10*3/uL — ABNORMAL HIGH (ref 0.00–0.07)
Basophils Absolute: 0 10*3/uL (ref 0.0–0.1)
Basophils Relative: 0 %
Eosinophils Absolute: 0 10*3/uL (ref 0.0–0.5)
Eosinophils Relative: 0 %
HCT: 39.9 % (ref 36.0–46.0)
Hemoglobin: 14.3 g/dL (ref 12.0–15.0)
Immature Granulocytes: 2 %
Lymphocytes Relative: 8 %
Lymphs Abs: 1.2 10*3/uL (ref 0.7–4.0)
MCH: 33.6 pg (ref 26.0–34.0)
MCHC: 35.8 g/dL (ref 30.0–36.0)
MCV: 93.7 fL (ref 80.0–100.0)
Monocytes Absolute: 1.5 10*3/uL — ABNORMAL HIGH (ref 0.1–1.0)
Monocytes Relative: 10 %
Neutro Abs: 12.3 10*3/uL — ABNORMAL HIGH (ref 1.7–7.7)
Neutrophils Relative %: 80 %
Platelets: 273 10*3/uL (ref 150–400)
RBC: 4.26 MIL/uL (ref 3.87–5.11)
RDW: 12.3 % (ref 11.5–15.5)
WBC: 15.3 10*3/uL — ABNORMAL HIGH (ref 4.0–10.5)
nRBC: 0 % (ref 0.0–0.2)

## 2020-10-02 LAB — PROTIME-INR
INR: 2.3 — ABNORMAL HIGH (ref 0.8–1.2)
Prothrombin Time: 24.8 seconds — ABNORMAL HIGH (ref 11.4–15.2)

## 2020-10-02 LAB — APTT: aPTT: 58 seconds — ABNORMAL HIGH (ref 24–36)

## 2020-10-02 LAB — LACTIC ACID, PLASMA: Lactic Acid, Venous: 3.2 mmol/L (ref 0.5–1.9)

## 2020-10-02 LAB — BRAIN NATRIURETIC PEPTIDE: B Natriuretic Peptide: 229.8 pg/mL — ABNORMAL HIGH (ref 0.0–100.0)

## 2020-10-02 LAB — TROPONIN I (HIGH SENSITIVITY)
Troponin I (High Sensitivity): 18 ng/L — ABNORMAL HIGH (ref <18)
Troponin I (High Sensitivity): 23 ng/L — ABNORMAL HIGH (ref <18)

## 2020-10-02 LAB — POC SARS CORONAVIRUS 2 AG -  ED: SARS Coronavirus 2 Ag: NEGATIVE

## 2020-10-02 LAB — PROCALCITONIN: Procalcitonin: 0.24 ng/mL

## 2020-10-02 LAB — LIPASE, BLOOD: Lipase: 48 U/L (ref 11–51)

## 2020-10-02 MED ORDER — POTASSIUM CHLORIDE 10 MEQ/100ML IV SOLN: 10.0000 meq | INTRAVENOUS | Status: AC

## 2020-10-02 MED ORDER — SODIUM CHLORIDE 0.9 % IV SOLN: 2.0000 g | INTRAVENOUS | Status: DC

## 2020-10-02 MED ORDER — SODIUM CHLORIDE 0.9 % IV SOLN: 500.0000 mg | INTRAVENOUS | Status: DC

## 2020-10-02 MED ORDER — ONDANSETRON HCL 4 MG/2ML IJ SOLN: 4.0000 mg | Freq: Four times a day (QID) | INTRAMUSCULAR | Status: DC | PRN

## 2020-10-02 MED ORDER — ACETAMINOPHEN 325 MG PO TABS: 650.0000 mg | ORAL_TABLET | Freq: Four times a day (QID) | ORAL | Status: DC | PRN

## 2020-10-02 MED ORDER — ONDANSETRON HCL 4 MG PO TABS: 4.0000 mg | ORAL_TABLET | Freq: Four times a day (QID) | ORAL | Status: DC | PRN

## 2020-10-02 MED ORDER — MORPHINE SULFATE (PF) 2 MG/ML IV SOLN: 2.0000 mg | INTRAVENOUS | Status: DC | PRN

## 2020-10-02 MED ORDER — SODIUM CHLORIDE 0.9 % IV BOLUS
1000.0000 mL | Freq: Once | INTRAVENOUS | Status: AC
  Administered 2020-10-03: 1000 mL via INTRAVENOUS

## 2020-10-02 MED ORDER — SODIUM CHLORIDE 0.9 % IV SOLN
2.0000 g | INTRAVENOUS | Status: DC
  Administered 2020-10-02 – 2020-10-05 (×4): 2 g via INTRAVENOUS
  Filled 2020-10-02 (×2): qty 20
  Filled 2020-10-02: qty 2
  Filled 2020-10-02 (×2): qty 20

## 2020-10-02 MED ORDER — SODIUM CHLORIDE 0.9 % IV BOLUS: 500.0000 mL | Freq: Once | INTRAVENOUS | Status: DC

## 2020-10-02 MED ORDER — SODIUM CHLORIDE 0.9 % IV SOLN
500.0000 mg | INTRAVENOUS | Status: DC
  Administered 2020-10-02 – 2020-10-04 (×3): 500 mg via INTRAVENOUS
  Filled 2020-10-02 (×4): qty 500

## 2020-10-02 MED ORDER — SODIUM CHLORIDE 0.9 % IV BOLUS (SEPSIS)
500.0000 mL | Freq: Once | INTRAVENOUS | Status: AC
  Administered 2020-10-03: 500 mL via INTRAVENOUS

## 2020-10-02 MED ORDER — HYDROCODONE-ACETAMINOPHEN 5-325 MG PO TABS: 1.0000 | ORAL_TABLET | ORAL | Status: DC | PRN

## 2020-10-02 MED ORDER — ACETAMINOPHEN 650 MG RE SUPP: 650.0000 mg | Freq: Four times a day (QID) | RECTAL | Status: DC | PRN

## 2020-10-02 MED ORDER — SODIUM CHLORIDE 0.9 % IV SOLN
INTRAVENOUS | Status: DC
  Administered 2020-10-03 (×2): 100 mL via INTRAVENOUS

## 2020-10-02 NOTE — ED Triage Notes (Signed)
Pt here with syncope and a fall today. Intial ekg by ems showed stemi. Pt states that she felt weak. Pt NAD on arrival.

## 2020-10-02 NOTE — H&P (Signed)
History and Physical    Megan Becker ZOX:[id number] DOB: 1935-08-04 DOA: 10/02/2020  PCP: [facility]   Patient coming from: Home  I have personally briefly reviewed patient's old medical records in [facility]  Chief Complaint: Weakness, presyncope  HPI: Megan Becker is a 86 y.o. female with medical history significant for  CAD, mechanical heart valve secondary to rheumatic heart disease on Coumadin, HTN, A. fib, diastolic heart failure who presents to the emergency room by EMS initially as a code STEMI which was ruled out by cardiologist, Dr. Okey Dupre, who complains of a 1 month history of generalized weakness with poor appetite and poor oral intake as well as 1 day history of nonbloody nonbilious vomiting without coffee grounds and episodes of several episodes of diarrhea.  She reports mid back pain of moderate to severe intensity that radiates to the left shoulder, that is dull.  No aggravating or alleviating factors.  She reports weight loss and protracted weakness to where on the day of arrival she felt so weak she had to lower herself to the ground.  She did not fall.  She denies chest pain or shortness of breath and denies fever or chills.  She had a negative Covid test yesterday and was preparing for an outpatient endoscopy on 10/04/2020.. Most of the history is provided by the daughter at the bedside. ED Course: On arrival to the ED she was tachycardic at 121, tachypneic at 26 but afebrile and with blood pressure 147/96, O2 sat 96% on room air.  She had several abnormalities on her blood work including WBC of 15,000 with a lactic acid of 3.2, sodium 124, potassium 2.9 with bilirubin 2.3.  Troponin 18->23, and BNP 229.  Procalcitonin 0.24.  Covid again negative.  Lipase 48. Imaging: Chest x-ray showed several nodules and follow-up CT chest showed findings consistent with a metastatic process both lung fields, with omental caking and peritoneal implants in the abdomen.   Questionable masslike area involving the pancreatic body.  Small volume ascites in the abdomen and pelvis.  Patient was started on IV antibiotics, IV fluid.  Hospitalist consulted for admission.  Cardiologist Dr. Okey Dupre did read EKG and did not meet STEMI criteria  Review of Systems: As per HPI otherwise all other systems on review of systems negative.    Past Medical History:  Diagnosis Date   Arrhythmia    Asthma    Cancer (HCC)    skin   CHF (congestive heart failure) (HCC)    Coronary artery disease    Dysrhythmia    atrial fibrillation   GERD (gastroesophageal reflux disease)    HOH (hard of hearing)    Hx of basal cell carcinoma 07/2016   R zygoma at hair line, R post. auricular neck   Hypercholesteremia    Hypertension    Lower extremity edema    Mitral valve replaced    Osteopenia    Osteoporosis    Palpitations    Rheumatic heart disease    Vaginal bleeding     Past Surgical History:  Procedure Laterality Date   CATARACT EXTRACTION W/PHACO Left 04/16/2015   Procedure: CATARACT EXTRACTION PHACO AND INTRAOCULAR LENS PLACEMENT (IOC);  Surgeon: Sallee Lange, MD;  Location: [facility] ORS;  Service: Ophthalmology;  Laterality: Left;  [place]: 00:58 AP:22.7 CDE:23.44 PACK LOT: [id number]   CERVICAL CONE BIOPSY     COLONOSCOPY  2002   mitral valve replaced       reports that she has never smoked.  She has never used smokeless tobacco. She reports that she does not drink alcohol and does not use drugs.  No Known Allergies  History reviewed. No pertinent family history.    Prior to Admission medications   Medication Sig Start Date End Date Taking? Authorizing Provider  amLODipine (NORVASC) 5 MG tablet Take 5 mg by mouth daily.     [provider]  amoxicillin (AMOXIL) 500 MG tablet Take 2,000 mg by mouth as directed. (1 hour before dental procedures)    [provider]  aspirin 81 MG tablet Take 81 mg by mouth daily.    [provider]  atorvastatin (LIPITOR) 10 MG tablet Take 10 mg by mouth every evening.     [provider]  benazepril (LOTENSIN) 40 MG tablet Take 40 mg by mouth daily.    [provider]  Calcium-Vitamin D (CALTRATE 600 PLUS-VIT D PO) Take 1 tablet by mouth 2 (two) times daily.    [provider]  famotidine (PEPCID) 10 MG tablet Take 10 mg by mouth 2 (two) times daily as needed for heartburn or indigestion.    [provider]  furosemide (LASIX) 20 MG tablet Take 20 mg by mouth daily as needed for fluid.     [provider]  hydrochlorothiazide (HYDRODIURIL) 25 MG tablet Take 25 mg by mouth at bedtime.    [provider]  loratadine (CLARITIN) 10 MG tablet Take 10 mg by mouth daily.    [provider]  metoprolol tartrate (LOPRESSOR) 100 MG tablet Take 50-100 mg by mouth See admin instructions. Take 1 tablet (100mg ) by mouth every morning and take  tablet (50mg ) by mouth every evening    [provider]  Multiple Vitamins-Minerals (PRESERVISION AREDS PO) Take by mouth.    [provider]  Multiple Vitamins-Minerals (VISION-VITE PRESERVE PO) Take 1 tablet by mouth 2 (two) times daily.    [provider]  ondansetron (ZOFRAN-ODT) 4 MG disintegrating tablet Take 4 mg by mouth every 8 (eight) hours as needed for nausea or vomiting.    [provider]  pantoprazole (PROTONIX) 40 MG tablet Take 40 mg by mouth daily.    [provider]  potassium chloride (KLOR-CON) 10 MEQ tablet Take 1 tablet (10 mEq total) by mouth daily for 5 days. 07/14/19 07/19/19  Blake Divine, MD  TERBINAFINE HCL PO Take by mouth.    [provider]  warfarin (COUMADIN) 2 MG tablet Take 2 mg by mouth See admin instructions. Take 1 tablet (2mg ) by mouth every Sunday, Wednesday and Friday evening    [provider]  warfarin (COUMADIN) 3 MG tablet Take 3 mg by mouth See admin instructions. Take 1 tablet  (3mg ) by mouth every Tuesday, Thursday and Saturday evening    [provider]    Physical Exam: Vitals:   10/02/20 1900 10/02/20 2000 10/02/20 2030 10/02/20 2100  BP:  108/81 125/81 120/69  Pulse:   (!) 114 (!) 106  Resp:  (!) 30    Temp:      TempSrc:      SpO2:   95% 93%  Height: 5\' 3"  (1.6 m)        Vitals:   10/02/20 1900 10/02/20 2000 10/02/20 2030 10/02/20 2100  BP:  108/81 125/81 120/69  Pulse:   (!) 114 (!) 106  Resp:  (!) 30    Temp:      TempSrc:      SpO2:   95% 93%  Height:  5\' 3"  (1.6 m)         Constitutional:  Frail and ill-appearing, oriented x3.  Tachypneic with conversational dyspnea HEENT:      Head: Normocephalic and atraumatic.         Eyes: PERLA, EOMI, Conjunctivae are normal. Sclera is non-icteric.       Mouth/Throat: Mucous membranes are moist.       Neck: Supple with no signs of meningismus. Cardiovascular:  Tachycardic. No murmurs, gallops, or rubs. 2+ symmetrical distal pulses are present . No JVD. No LE edema Respiratory: Respiratory effort increased, with diminished and coarse breath sounds bilaterally bilaterally.   Gastrointestinal: Soft, non tender.  Non distended with positive bowel sounds.  Bruising lower abdomen Genitourinary: No CVA tenderness. Musculoskeletal: Nontender with normal range of motion in all extremities. No cyanosis, or erythema of extremities. Neurologic:  Face is symmetric. Moving all extremities. No gross focal neurologic deficits . Skin: Skin is warm, dry.  No rash or ulcers Psychiatric: Mood and affect are normal    Labs on Admission: I have personally reviewed following labs and imaging studies  CBC: Recent Labs  Lab 10/02/20 1901  WBC 15.3*  NEUTROABS 12.3*  HGB 14.3  HCT 39.9  MCV 93.7  PLT 123456   Basic Metabolic Panel: Recent Labs  Lab 10/02/20 1901  NA 124*  K 2.9*  CL 81*  CO2 27  GLUCOSE 179*  BUN 16  CREATININE 1.65*  CALCIUM 8.3*   GFR: CrCl cannot be calculated (Unknown  ideal weight.). Liver Function Tests: Recent Labs  Lab 10/02/20 1901  AST 35  ALT 20  ALKPHOS 123  BILITOT 2.3*  PROT 6.5  ALBUMIN 3.5   Recent Labs  Lab 10/02/20 1901  LIPASE 48   No results for input(s): AMMONIA in the last 168 hours. Coagulation Profile: Recent Labs  Lab 10/02/20 1901  INR 2.3*   Cardiac Enzymes: No results for input(s): CKTOTAL, CKMB, CKMBINDEX, TROPONINI in the last 168 hours. BNP (last 3 results) No results for input(s): PROBNP in the last 8760 hours. HbA1C: No results for input(s): HGBA1C in the last 72 hours. CBG: No results for input(s): GLUCAP in the last 168 hours. Lipid Profile: No results for input(s): CHOL, HDL, LDLCALC, TRIG, CHOLHDL, LDLDIRECT in the last 72 hours. Thyroid Function Tests: No results for input(s): TSH, T4TOTAL, FREET4, T3FREE, THYROIDAB in the last 72 hours. Anemia Panel: No results for input(s): VITAMINB12, FOLATE, FERRITIN, TIBC, IRON, RETICCTPCT in the last 72 hours. Urine analysis:    Component Value Date/Time   COLORURINE YELLOW (A) 09/10/2020 1339   APPEARANCEUR HAZY (A) 09/10/2020 1339   LABSPEC 1.011 09/10/2020 1339   PHURINE 5.0 09/10/2020 1339   GLUCOSEU NEGATIVE 09/10/2020 1339   HGBUR NEGATIVE 09/10/2020 1339   BILIRUBINUR NEGATIVE 09/10/2020 1339   KETONESUR NEGATIVE 09/10/2020 1339   PROTEINUR NEGATIVE 09/10/2020 1339   NITRITE NEGATIVE 09/10/2020 1339   LEUKOCYTESUR NEGATIVE 09/10/2020 1339    Radiological Exams on Admission: DG Chest Portable 1 View  Result Date: 10/02/2020 CLINICAL DATA:  Back pain, weakness EXAM: PORTABLE CHEST 1 VIEW COMPARISON:  None. FINDINGS: Diffuse bilateral nodular areas of consolidation. No significant pleural effusion. No pneumothorax. Cardiomediastinal contours are likely within normal limits for technique. Calcified plaque along the thoracic aorta. IMPRESSION: Diffuse bilateral nodular areas of consolidation. Chest CT recommended for further evaluation. Electronically  Signed   By: Macy Mis M.D.   On: 10/02/2020 19:41   CT CHEST ABDOMEN PELVIS WO CONTRAST  Result Date: 10/02/2020  CLINICAL DATA:  Abdominal distension. Low back pain. Pain status post fall. EXAM: CT CHEST, ABDOMEN AND PELVIS WITHOUT CONTRAST TECHNIQUE: Multidetector CT imaging of the chest, abdomen and pelvis was performed following the standard protocol without IV contrast. COMPARISON:  None. FINDINGS: CT CHEST FINDINGS Cardiovascular: The heart is enlarged. There are coronary artery calcifications. There are atherosclerotic changes of the thoracic aorta. There is no evidence for an aneurysm or large pleural effusion. Mediastinum/Nodes: --there are pathologically enlarged mediastinal lymph nodes. For example there is a 3.1 cm partially calcified lymph node in the AP window (axial series 2, image 27). -- No hilar lymphadenopathy. -- No axillary lymphadenopathy. -- No supraclavicular lymphadenopathy. -- Normal thyroid gland where visualized. -  Unremarkable esophagus. Lungs/Pleura: There are innumerable pulmonary nodules throughout the bilateral lung fields. The largest is located in the right lower lobe and measures approximately 3.1 cm. There is no pneumothorax. There is no large pleural effusion. Musculoskeletal: There is no acute displaced fracture. There is a sclerotic lesion involving the posterior T10 vertebral body. CT ABDOMEN PELVIS FINDINGS Hepatobiliary: The liver is somewhat cirrhotic in appearance. There is layering hyperdense debris within the gallbladder which may represent sludge or small stones.There is no biliary ductal dilation. Pancreas: There is a questionable masslike area at the level of the pancreatic body. The distal pancreatic body and tail are somewhat atrophic. This is not well evaluated in the absence of IV contrast (axial series 2, image 58). Spleen: Unremarkable. Adrenals/Urinary Tract: --Adrenal glands: Unremarkable. --Right kidney/ureter: No hydronephrosis or radiopaque kidney  stones. --Left kidney/ureter: No hydronephrosis or radiopaque kidney stones. --Urinary bladder: Unremarkable. Stomach/Bowel: --Stomach/Duodenum: No hiatal hernia or other gastric abnormality. Normal duodenal course and caliber. --Small bowel: Unremarkable. --Colon: There is scattered colonic diverticula without CT evidence for diverticulitis. There is questionable mild wall thickening of the ascending colon. --Appendix: Normal. Vascular/Lymphatic: Atherosclerotic calcification is present within the non-aneurysmal abdominal aorta, without hemodynamically significant stenosis. --there is mild retroperitoneal adenopathy. --No mesenteric lymphadenopathy. --No pelvic or inguinal lymphadenopathy. Reproductive: Unremarkable Other: There is a small volume of abdominal ascites. Multiple peritoneal implants and omental caking is noted. Musculoskeletal. No acute displaced fractures. Multiple pockets of subcutaneous gas are noted in the anterior abdominal wall, likely related to subcutaneous injections. IMPRESSION: 1. Findings are consistent with a metastatic process as evidence by innumerable pulmonary nodules throughout both lung fields in addition to findings concerning for omental caking and peritoneal implants within the abdomen. The primary lesion is not clearly identified, however there is a questionable masslike area involving the pancreatic body that is not well characterized in the absence of IV contrast. 2. Small volume ascites in the abdomen and pelvis. 3. Additional chronic findings as detailed above. Electronically Signed   By: Constance Holster M.D.   On: 10/02/2020 22:14     Assessment/Plan 86 year old female with history of CAD, mechanical heart valve secondary to rheumatic heart disease on Coumadin, HTN, A. fib, diastolic heart failure who presents to the emergency room by EMS initially as a code STEMI which was ruled out by cardiologist, Dr. Saunders Revel, presenting with generalized weakness, weight loss, poor  appetite, vomiting and diarrhea     Generalized weakness   Hyponatremia   Hypokalemia   Lung metastasis ([place])   Pancreatic mass on CT -Patient presents with generalized weakness with several electrolyte abnormalities and with imaging showing extensive lung metastases with possible pancreatic primary -Hyponatremia: Possibly secondary to SIADH as well as volume depletion from vomiting and diarrhea IV hydration with normal saline and monitor -  For hypokalemia: Secondary to vomiting IV potassium repletion -Generalized weakness: Nutritionist and physical therapy consult -Lung metastases with possible pancreatic primary: Oncology consult in the a.m.    Sepsis ([place]) versus lactic acidosis Possible postobstructive pneumonia Possible acute gastroenteritis -Patient with vomiting and diarrhea, tachycardic and tachypneic, WBC 15,000 with lactic acid 3.2, procalcitonin 0.24 -With GI symptoms, could be gastroenteritis but given multiple pulmonary nodules we will also treat for pneumonia -We will treat as sepsis though with elevated WBC could be reactive and lactic acid could be related to diarrhea -Can consider further evaluation with CT abdomen and pelvis especially in view of pancreatic mass seen above      Atrial fibrillation ([place]) -Rate controlled.  Continue Coumadin    CAD (coronary artery disease) -Troponin slightly elevated at 18->23, likely related to demand -Patient initially presented as a code STEMI but ruled out by cardiology    Chronic heart failure with preserved ejection fraction (HCC) -BNP elevated above 200 -Patient mostly hypovolemic but monitor for fluid overload in view of IV hydration    Hypertension -Continue amlodipine    S/P MVR (mitral valve replacement)   Chronic anticoagulation -Pharmacy for warfarin management    AKI (acute kidney injury) ([place]) -Creatinine 1.65 above baseline of 0.8 a year ago -Prerenal related to fluid losses from diarrhea    DVT prophylaxis:  Coumadin Code Status: full code  Family Communication: Daughter Disposition Plan: Back to previous home environment Consults called: none  Status:At the time of admission, it appears that the appropriate admission status for this patient is INPATIENT. This is judged to be reasonable and necessary in order to provide the required intensity of service to ensure the patient's safety given the presenting symptoms, physical exam findings, and initial radiographic and laboratory data in the context of their  Comorbid conditions.   Patient requires inpatient status due to high intensity of service, high risk for further deterioration and high frequency of surveillance required.   I certify that at the point of admission it is my clinical judgment that the patient will require inpatient hospital care spanning beyond [place] MD [facility]     10/02/2020, 11:26 PM

## 2020-10-02 NOTE — Progress Notes (Signed)
CODE SEPSIS - PHARMACY COMMUNICATION  **Broad Spectrum Antibiotics should be administered within 1 hour of Sepsis diagnosis**  Time Code Sepsis Called/Page Received: 2350  Antibiotics Ordered: Rocephin and Zithromax  Time of 1st antibiotic administration: 2228 (already given)  Additional action taken by pharmacy: n/a  If necessary, Name of Provider/Nurse Contacted: n/a    Wayland Denis ,PharmD Clinical Pharmacist  10/02/2020  11:52 PM

## 2020-10-02 NOTE — ED Provider Notes (Signed)
[facility] Emergency Department Provider Note  ____________________________________________  Time seen: Approximately 7:24 PM  I have reviewed the triage vital signs and the nursing notes.   HISTORY  Chief Complaint Back pain   HPI Megan Becker is a 86 y.o. female with a history of CHF, atrial fibrillation on Coumadin, hypertension, GERD who is brought to the ED by EMS today due to mid back pain that radiates up to the shoulder.  Associated with shortness of breath.   Patient also reports that over the last 3 or 4 days she has had generalized weakness and fatigue, loss of appetite and poor oral intake.  She is also had vomiting and a few episodes of diarrhea.  No specific chest pain or exertional symptoms.  She does note that while she was walking earlier today she felt so weak that she had to lower herself to the ground.  She did not pass out or hit her head during that time.  EMS reports that she did have a syncopal episode earlier today, patient is unsure.     Past Medical History:  Diagnosis Date  . Arrhythmia   . Asthma   . Cancer ([place])    skin  . CHF (congestive heart failure) ([place])   . Coronary artery disease   . Dysrhythmia    atrial fibrillation  . GERD (gastroesophageal reflux disease)   . HOH (hard of hearing)   . Hx of basal cell carcinoma 07/2016   R zygoma at hair line, R post. auricular neck  . Hypercholesteremia   . Hypertension   . Lower extremity edema   . Mitral valve replaced   . Osteopenia   . Osteoporosis   . Palpitations   . Rheumatic heart disease   . Vaginal bleeding      Patient Active Problem List   Diagnosis Date Noted  . Lung metastasis ([place]) 10/02/2020  . Hyponatremia 10/02/2020  . Hypokalemia 10/02/2020  . Pancreatic mass on CT 10/02/2020  . Generalized weakness 10/02/2020  . Chronic anticoagulation 10/02/2020  . AKI (acute kidney injury) ([place]) 10/02/2020  . Chronic heart failure with preserved ejection  fraction ([place]) 09/07/2020  . S/P MVR (mitral valve replacement) 09/07/2020  . Atrial fibrillation ([place]) 11/19/2011  . CAD (coronary artery disease) 11/19/2011  . Hypertension 11/19/2011     Past Surgical History:  Procedure Laterality Date  . CATARACT EXTRACTION W/PHACO Left 04/16/2015   Procedure: CATARACT EXTRACTION PHACO AND INTRAOCULAR LENS PLACEMENT (IOC);  Surgeon: Estill Cotta, MD;  Location: [facility] ORS;  Service: Ophthalmology;  Laterality: Left;  [place]: 00:58 AP:22.7 CDE:23.44 PACK LOT: [id number]  . CERVICAL CONE BIOPSY    . COLONOSCOPY  2002  . mitral valve replaced       Prior to Admission medications   Medication Sig Start Date End Date Taking? Authorizing Provider  amLODipine (NORVASC) 5 MG tablet Take 5 mg by mouth daily.     [provider]  amoxicillin (AMOXIL) 500 MG tablet Take 2,000 mg by mouth as directed. (1 hour before dental procedures)    [provider]  aspirin 81 MG tablet Take 81 mg by mouth daily.    [provider]  atorvastatin (LIPITOR) 10 MG tablet Take 10 mg by mouth every evening.     [provider]  benazepril (LOTENSIN) 40 MG tablet Take 40 mg by mouth daily.    [provider]  Calcium-Vitamin D (CALTRATE 600 PLUS-VIT D PO) Take 1 tablet by mouth 2 (two) times  daily.    [provider]  famotidine (PEPCID) 10 MG tablet Take 10 mg by mouth 2 (two) times daily as needed for heartburn or indigestion.    [provider]  furosemide (LASIX) 20 MG tablet Take 20 mg by mouth daily as needed for fluid.     [provider]  hydrochlorothiazide (HYDRODIURIL) 25 MG tablet Take 25 mg by mouth at bedtime.    [provider]  loratadine (CLARITIN) 10 MG tablet Take 10 mg by mouth daily.    [provider]  metoprolol tartrate (LOPRESSOR) 100 MG tablet Take 50-100 mg by mouth See admin instructions. Take 1 tablet (100mg ) by mouth every morning and take  tablet (50mg ) by  mouth every evening    [provider]  Multiple Vitamins-Minerals (PRESERVISION AREDS PO) Take by mouth.    [provider]  Multiple Vitamins-Minerals (VISION-VITE PRESERVE PO) Take 1 tablet by mouth 2 (two) times daily.    [provider]  ondansetron (ZOFRAN-ODT) 4 MG disintegrating tablet Take 4 mg by mouth every 8 (eight) hours as needed for nausea or vomiting.    [provider]  pantoprazole (PROTONIX) 40 MG tablet Take 40 mg by mouth daily.    [provider]  potassium chloride (KLOR-CON) 10 MEQ tablet Take 1 tablet (10 mEq total) by mouth daily for 5 days. 07/14/19 07/19/19  Blake Divine, MD  TERBINAFINE HCL PO Take by mouth.    [provider]  warfarin (COUMADIN) 2 MG tablet Take 2 mg by mouth See admin instructions. Take 1 tablet (2mg ) by mouth every Sunday, Wednesday and Friday evening    [provider]  warfarin (COUMADIN) 3 MG tablet Take 3 mg by mouth See admin instructions. Take 1 tablet (3mg ) by mouth every Tuesday, Thursday and Saturday evening    [provider]     Allergies Patient has no known allergies.   No family history on file.  Social History Social History   Tobacco Use  . Smoking status: Never Smoker  . Smokeless tobacco: Never Used  Substance Use Topics  . Alcohol use: No  . Drug use: Never    Review of Systems  Constitutional:   No fever positive chills.  ENT:   No sore throat. No rhinorrhea. Cardiovascular:   No chest pain or syncope. Respiratory:   Positive shortness of breath and nonproductive cough. Gastrointestinal:   Negative for abdominal pain, positive vomiting and diarrhea.  Musculoskeletal: .  Positive back pain All other systems reviewed and are negative except as documented above in ROS and HPI.  ____________________________________________   PHYSICAL EXAM:  VITAL SIGNS: ED Triage Vitals  Enc Vitals Group     BP 10/02/20 1859 (!) 147/96     Pulse  Rate 10/02/20 1859 (!) 121     Resp 10/02/20 1859 (!) 26     Temp 10/02/20 1859 (!) 97.5 F (36.4 C)     Temp Source 10/02/20 1859 Oral     SpO2 10/02/20 1859 96 %     Weight --      Height 10/02/20 1900 5\' 3"  (1.6 m)     Head Circumference --      Peak Flow --      Pain Score --      Pain Loc --      Pain Edu? --      Excl. in [place]? --     Vital signs reviewed, nursing assessments reviewed.   Constitutional:   Alert  and oriented. Non-toxic appearance. Eyes:   Conjunctivae are normal. EOMI. PERRL. ENT      Head:   Normocephalic and atraumatic.      Nose: Normal      Mouth/Throat: Dry mucous membranes      Neck:   No meningismus. Full ROM. Hematological/Lymphatic/Immunilogical:   No cervical lymphadenopathy. Cardiovascular:   Irregularly irregular rhythm, tachycardia heart rate 120-140. Symmetric bilateral radial and DP pulses.  No murmurs. Cap refill less than 2 seconds. Respiratory:   Tachypnea without accessory muscle use.  Symmetric breath sounds without wheezing or crackles. Gastrointestinal:   Soft and nontender. Non distended. There is no CVA tenderness.  No rebound, rigidity, or guarding.  Abdominal wall is ecchymotic from Lovenox injections with localized soft tissue firmness from subcutaneous hematoma.  No inflammatory changes to suggest infection  Musculoskeletal:   Normal range of motion in all extremities. No joint effusions.  No lower extremity tenderness.  No edema. Neurologic:   Normal speech and language.  Motor grossly intact. No acute focal neurologic deficits are appreciated.  Skin:    Skin is warm, dry and intact. No rash noted.  No petechiae, purpura, or bullae.  ____________________________________________    LABS (pertinent positives/negatives) (all labs ordered are listed, but only abnormal results are displayed) Labs Reviewed  COMPREHENSIVE METABOLIC PANEL - Abnormal; Notable for the following components:      Result Value   Sodium 124 (*)     Potassium 2.9 (*)    Chloride 81 (*)    Glucose, Bld 179 (*)    Creatinine, Ser 1.65 (*)    Calcium 8.3 (*)    Total Bilirubin 2.3 (*)    GFR, Estimated 31 (*)    Anion gap 16 (*)    All other components within normal limits  CBC WITH DIFFERENTIAL/PLATELET - Abnormal; Notable for the following components:   WBC 15.3 (*)    Neutro Abs 12.3 (*)    Monocytes Absolute 1.5 (*)    Abs Immature Granulocytes 0.26 (*)    All other components within normal limits  PROTIME-INR - Abnormal; Notable for the following components:   Prothrombin Time 24.8 (*)    INR 2.3 (*)    All other components within normal limits  APTT - Abnormal; Notable for the following components:   aPTT 58 (*)    All other components within normal limits  BRAIN NATRIURETIC PEPTIDE - Abnormal; Notable for the following components:   B Natriuretic Peptide 229.8 (*)    All other components within normal limits  TROPONIN I (HIGH SENSITIVITY) - Abnormal; Notable for the following components:   Troponin I (High Sensitivity) 18 (*)    All other components within normal limits  TROPONIN I (HIGH SENSITIVITY) - Abnormal; Notable for the following components:   Troponin I (High Sensitivity) 23 (*)    All other components within normal limits  CULTURE, BLOOD (SINGLE)  GASTROINTESTINAL PANEL BY PCR, STOOL (REPLACES STOOL CULTURE)  C DIFFICILE QUICK SCREEN W PCR REFLEX  LIPASE, BLOOD  PROCALCITONIN  LACTIC ACID, PLASMA  LACTIC ACID, PLASMA  POC SARS CORONAVIRUS 2 AG -  ED   ____________________________________________   EKG  Interpreted by me Atrial fibrillation, rate of 107.  Normal axis and intervals.  Poor R wave progression.  Likely LVH.  Inferolateral ST depressions and T wave inversion, no ST elevation.  No significant change from previous EKG September 11, 2020.  ____________________________________________    RADIOLOGY  DG Chest Portable 1 View  Result Date:  10/02/2020 CLINICAL DATA:  Back pain, weakness  EXAM: PORTABLE CHEST 1 VIEW COMPARISON:  None. FINDINGS: Diffuse bilateral nodular areas of consolidation. No significant pleural effusion. No pneumothorax. Cardiomediastinal contours are likely within normal limits for technique. Calcified plaque along the thoracic aorta. IMPRESSION: Diffuse bilateral nodular areas of consolidation. Chest CT recommended for further evaluation. Electronically Signed   By: Macy Mis M.D.   On: 10/02/2020 19:41   CT CHEST ABDOMEN PELVIS WO CONTRAST  Result Date: 10/02/2020 CLINICAL DATA:  Abdominal distension. Low back pain. Pain status post fall. EXAM: CT CHEST, ABDOMEN AND PELVIS WITHOUT CONTRAST TECHNIQUE: Multidetector CT imaging of the chest, abdomen and pelvis was performed following the standard protocol without IV contrast. COMPARISON:  None. FINDINGS: CT CHEST FINDINGS Cardiovascular: The heart is enlarged. There are coronary artery calcifications. There are atherosclerotic changes of the thoracic aorta. There is no evidence for an aneurysm or large pleural effusion. Mediastinum/Nodes: --there are pathologically enlarged mediastinal lymph nodes. For example there is a 3.1 cm partially calcified lymph node in the AP window (axial series 2, image 27). -- No hilar lymphadenopathy. -- No axillary lymphadenopathy. -- No supraclavicular lymphadenopathy. -- Normal thyroid gland where visualized. -  Unremarkable esophagus. Lungs/Pleura: There are innumerable pulmonary nodules throughout the bilateral lung fields. The largest is located in the right lower lobe and measures approximately 3.1 cm. There is no pneumothorax. There is no large pleural effusion. Musculoskeletal: There is no acute displaced fracture. There is a sclerotic lesion involving the posterior T10 vertebral body. CT ABDOMEN PELVIS FINDINGS Hepatobiliary: The liver is somewhat cirrhotic in appearance. There is layering hyperdense debris within the gallbladder which may represent sludge or small stones.There is  no biliary ductal dilation. Pancreas: There is a questionable masslike area at the level of the pancreatic body. The distal pancreatic body and tail are somewhat atrophic. This is not well evaluated in the absence of IV contrast (axial series 2, image 58). Spleen: Unremarkable. Adrenals/Urinary Tract: --Adrenal glands: Unremarkable. --Right kidney/ureter: No hydronephrosis or radiopaque kidney stones. --Left kidney/ureter: No hydronephrosis or radiopaque kidney stones. --Urinary bladder: Unremarkable. Stomach/Bowel: --Stomach/Duodenum: No hiatal hernia or other gastric abnormality. Normal duodenal course and caliber. --Small bowel: Unremarkable. --Colon: There is scattered colonic diverticula without CT evidence for diverticulitis. There is questionable mild wall thickening of the ascending colon. --Appendix: Normal. Vascular/Lymphatic: Atherosclerotic calcification is present within the non-aneurysmal abdominal aorta, without hemodynamically significant stenosis. --there is mild retroperitoneal adenopathy. --No mesenteric lymphadenopathy. --No pelvic or inguinal lymphadenopathy. Reproductive: Unremarkable Other: There is a small volume of abdominal ascites. Multiple peritoneal implants and omental caking is noted. Musculoskeletal. No acute displaced fractures. Multiple pockets of subcutaneous gas are noted in the anterior abdominal wall, likely related to subcutaneous injections. IMPRESSION: 1. Findings are consistent with a metastatic process as evidence by innumerable pulmonary nodules throughout both lung fields in addition to findings concerning for omental caking and peritoneal implants within the abdomen. The primary lesion is not clearly identified, however there is a questionable masslike area involving the pancreatic body that is not well characterized in the absence of IV contrast. 2. Small volume ascites in the abdomen and pelvis. 3. Additional chronic findings as detailed above. Electronically Signed    By: Constance Holster M.D.   On: 10/02/2020 22:14    ____________________________________________   PROCEDURES Procedures  ____________________________________________  DIFFERENTIAL DIAGNOSIS   Dehydration, electrolyte abnormality, non-STEMI, viral illness, COVID-19 infection, UTI, pneumonia, pulmonary edema  CLINICAL IMPRESSION / ASSESSMENT AND PLAN / ED COURSE  Medications ordered  in the ED: Medications  sodium chloride 0.9 % bolus 500 mL (has no administration in time range)  cefTRIAXone (ROCEPHIN) 2 g in sodium chloride 0.9 % 100 mL IVPB (2 g Intravenous New Bag/Given 10/02/20 2228)  azithromycin (ZITHROMAX) 500 mg in sodium chloride 0.9 % 250 mL IVPB (has no administration in time range)    Pertinent labs & imaging results that were available during my care of the patient were reviewed by me and considered in my medical decision making (see chart for details).  Megan Becker was evaluated in Emergency Department on 10/02/2020 for the symptoms described in the history of present illness. She was evaluated in the context of the global COVID-19 pandemic, which necessitated consideration that the patient might be at risk for infection with the SARS-CoV-2 virus that causes COVID-19. Institutional protocols and algorithms that pertain to the evaluation of patients at risk for COVID-19 are in a state of rapid change based on information released by regulatory bodies including the CDC and federal and state organizations. These policies and algorithms were followed during the patient's care in the ED.     Clinical Course as of 10/02/20 2320  Tue Oct 02, 2020  2637 Patient presents with mid back pain radiating up to her shoulder with multiple constitutional symptoms including chills, fatigue, loss of appetite and decreased oral intake over the past few days.  Prehospital EKGs discussed with cardiology Dr. Okey Dupre who feels they are not consistent with STEMI.  Agree that current presentation is  unlikely to be ACS.  Suspect viral syndrome, possibly Covid.  Will give IV fluids for hydration, check labs and chest x-ray [PS]  1930 Chest x-ray viewed and interpreted by me, shows multifocal infiltrates consistent with pulmonary edema versus covid pneumonia or multifocal pneumonia [PS]  2130 Reminded nurse of need for lactate, blood culture, and abx administration [PS]  2252 CT imaging concerning for metastatic cancer, hyponatremia may be due to SIADH.  Hospitalist consult requested [PS]    Clinical Course User Index [PS] Sharman Cheek, MD    ----------------------------------------- 11:20 PM on 10/02/2020 -----------------------------------------  Patient and daughter informed of CT findings. Case d/w hospitalist.    ____________________________________________   FINAL CLINICAL IMPRESSION(S) / ED DIAGNOSES    Final diagnoses:  Respiratory disease  Abdominal pain  Metastatic malignant neoplasm, unspecified site (HCC)  SIADH (syndrome of inappropriate ADH production) [facility])     ED Discharge Orders    None      Portions of this note were generated with dragon dictation software. Dictation errors may occur despite best attempts at proofreading.   Sharman Cheek, MD 10/02/20 2321

## 2020-10-02 NOTE — Telephone Encounter (Signed)
[facility] STEMI Evaluation Note  I was contacted by [facility] regarding code STEMI activated for this patient by EMS.  EMS EKGs were reviewed and demonstrate atrial fibrillation with rapid ventricular response, LVH, and ST/T changes consistent with abnormal repolarization and similar to prior tracings most recently 09/10/2020.  There is accentuation of ST elevation isolated to aVL and lead V1.  EMS EKG does not meet STEMI criteria.  Per [facility], the patient does not report chest pain but nausea and fatigue.  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  She has had progressive fatigue, shortness of breath, and weight loss over the last several months.  I recommend cancellation of code STEMI and evaluation in the ED by the ED team.  If her EKG here is consistent with STEMI or she has symptoms concerning for ongoing myocardial ischemia, please contact me for evaluation.  Yvonne Kendall, MD [facility]

## 2020-10-03 ENCOUNTER — Encounter: Payer: Self-pay | Admitting: Anesthesiology

## 2020-10-03 ENCOUNTER — Encounter
Admission: EM | Disposition: A | Discharge status: Home / Self Care | Payer: Self-pay | Source: Home / Self Care | Attending: Hospitalist

## 2020-10-03 ENCOUNTER — Ambulatory Visit
Admission: RE | Admit: 2020-10-03 | Payer: PRIVATE HEALTH INSURANCE | Source: Home / Self Care | Admitting: Internal Medicine

## 2020-10-03 ENCOUNTER — Encounter: Payer: Self-pay | Admitting: Internal Medicine

## 2020-10-03 DIAGNOSIS — C801 Malignant (primary) neoplasm, unspecified: Secondary | ICD-10-CM | POA: Diagnosis present

## 2020-10-03 DIAGNOSIS — N179 Acute kidney failure, unspecified: Secondary | ICD-10-CM

## 2020-10-03 DIAGNOSIS — E871 Hypo-osmolality and hyponatremia: Secondary | ICD-10-CM

## 2020-10-03 HISTORY — DX: Atherosclerotic heart disease of native coronary artery without angina pectoris: I25.10

## 2020-10-03 HISTORY — DX: Heart failure, unspecified: I50.9

## 2020-10-03 HISTORY — DX: Cardiac arrhythmia, unspecified: I49.9

## 2020-10-03 HISTORY — DX: Other specified disorders of bone density and structure, unspecified site: M85.80

## 2020-10-03 HISTORY — DX: Rheumatic heart disease, unspecified: I09.9

## 2020-10-03 LAB — GASTROINTESTINAL PANEL BY PCR, STOOL (REPLACES STOOL CULTURE)

## 2020-10-03 LAB — COMPREHENSIVE METABOLIC PANEL
ALT: 20 U/L (ref 0–44)
AST: 42 U/L — ABNORMAL HIGH (ref 15–41)
Albumin: 2.6 g/dL — ABNORMAL LOW (ref 3.5–5.0)
Alkaline Phosphatase: 119 U/L (ref 38–126)
Anion gap: 10 (ref 5–15)
BUN: 18 mg/dL (ref 8–23)
CO2: 27 mmol/L (ref 22–32)
Calcium: 7.3 mg/dL — ABNORMAL LOW (ref 8.9–10.3)
Chloride: 89 mmol/L — ABNORMAL LOW (ref 98–111)
Creatinine, Ser: 1.76 mg/dL — ABNORMAL HIGH (ref 0.44–1.00)
GFR, Estimated: 28 mL/min — ABNORMAL LOW (ref >60)
Glucose, Bld: 123 mg/dL — ABNORMAL HIGH (ref 70–99)
Potassium: 2.7 mmol/L — CL (ref 3.5–5.1)
Sodium: 126 mmol/L — ABNORMAL LOW (ref 135–145)
Total Bilirubin: 1.6 mg/dL — ABNORMAL HIGH (ref 0.3–1.2)
Total Protein: 5.2 g/dL — ABNORMAL LOW (ref 6.5–8.1)

## 2020-10-03 LAB — BLOOD CULTURE ID PANEL (REFLEXED) - BCID2

## 2020-10-03 LAB — CBC WITH DIFFERENTIAL/PLATELET
Abs Immature Granulocytes: 0.3 10*3/uL — ABNORMAL HIGH (ref 0.00–0.07)
Basophils Absolute: 0.1 10*3/uL (ref 0.0–0.1)
Basophils Relative: 0 %
Eosinophils Absolute: 0.1 10*3/uL (ref 0.0–0.5)
Eosinophils Relative: 0 %
HCT: 34.2 % — ABNORMAL LOW (ref 36.0–46.0)
Hemoglobin: 12.1 g/dL (ref 12.0–15.0)
Immature Granulocytes: 1 %
Lymphocytes Relative: 2 %
Lymphs Abs: 0.6 10*3/uL — ABNORMAL LOW (ref 0.7–4.0)
MCH: 33.8 pg (ref 26.0–34.0)
MCHC: 35.4 g/dL (ref 30.0–36.0)
MCV: 95.5 fL (ref 80.0–100.0)
Monocytes Absolute: 1.6 10*3/uL — ABNORMAL HIGH (ref 0.1–1.0)
Monocytes Relative: 7 %
Neutro Abs: 21.3 10*3/uL — ABNORMAL HIGH (ref 1.7–7.7)
Neutrophils Relative %: 90 %
Platelets: 210 10*3/uL (ref 150–400)
RBC: 3.58 MIL/uL — ABNORMAL LOW (ref 3.87–5.11)
RDW: 12.4 % (ref 11.5–15.5)
Smear Review: NORMAL
WBC: 23.9 10*3/uL — ABNORMAL HIGH (ref 4.0–10.5)
nRBC: 0 % (ref 0.0–0.2)

## 2020-10-03 LAB — APTT: aPTT: 48 seconds — ABNORMAL HIGH (ref 24–36)

## 2020-10-03 LAB — PROTIME-INR
INR: 2.8 — ABNORMAL HIGH (ref 0.8–1.2)
Prothrombin Time: 28.5 seconds — ABNORMAL HIGH (ref 11.4–15.2)

## 2020-10-03 LAB — SODIUM
Sodium: 127 mmol/L — ABNORMAL LOW (ref 135–145)
Sodium: 128 mmol/L — ABNORMAL LOW (ref 135–145)

## 2020-10-03 LAB — C DIFFICILE QUICK SCREEN W PCR REFLEX
C Diff antigen: NEGATIVE
C Diff interpretation: NOT DETECTED
C Diff toxin: NEGATIVE

## 2020-10-03 LAB — LACTIC ACID, PLASMA: Lactic Acid, Venous: 2.9 mmol/L (ref 0.5–1.9)

## 2020-10-03 SURGERY — EGD (ESOPHAGOGASTRODUODENOSCOPY)
Anesthesia: General

## 2020-10-03 MED ORDER — POTASSIUM CHLORIDE 10 MEQ/100ML IV SOLN
10.0000 meq | INTRAVENOUS | Status: AC
Start: 2020-10-03 — End: 2020-10-03
  Administered 2020-10-03 (×3): 10 meq via INTRAVENOUS
  Filled 2020-10-03 (×3): qty 100

## 2020-10-03 MED ORDER — WARFARIN - PHARMACIST DOSING INPATIENT
Freq: Every day | Status: DC
  Filled 2020-10-03: qty 1

## 2020-10-03 MED ORDER — POTASSIUM CHLORIDE 20 MEQ PO PACK: 40.0000 meq | PACK | ORAL | Status: DC

## 2020-10-03 MED ORDER — WARFARIN SODIUM 2 MG PO TABS
2.0000 mg | ORAL_TABLET | Freq: Once | ORAL | Status: AC
  Administered 2020-10-03: 2 mg via ORAL
  Filled 2020-10-03 (×3): qty 1

## 2020-10-03 MED ORDER — POTASSIUM CHLORIDE 10 MEQ/100ML IV SOLN
10.0000 meq | Freq: Once | INTRAVENOUS | Status: AC
  Administered 2020-10-03: 10 meq via INTRAVENOUS
  Filled 2020-10-03: qty 100

## 2020-10-03 NOTE — ED Notes (Signed)
Sent msg to receiving nurse. 

## 2020-10-03 NOTE — ED Notes (Signed)
Turned IV potassium to 2mL/hr per pt request because states still burning above IV site.

## 2020-10-03 NOTE — ED Notes (Signed)
Attempted multiple times to fix "incomplete documentation" on NaCl bolus that ws given at 2030 on 10/02/20. Unable to correct. Epic shows error message of "no linkable lines". Spoke with CN who told this nurse to remove and put back IV on avatar. Did this, still getting message "no linkable lines". Night nurse stated that bolus was given.

## 2020-10-03 NOTE — Progress Notes (Signed)
PROGRESS NOTE    Megan Becker  FAO:[id number] DOB: 1935-02-16 DOA: 10/02/2020 PCP: [facility]    Assessment & Plan:   Active Problems:   Atrial fibrillation (HCC)   CAD (coronary artery disease)   Chronic heart failure with preserved ejection fraction (HCC)   Hypertension   S/P MVR (mitral valve replacement)   Lung metastasis (HCC)   Hyponatremia   Hypokalemia   Pancreatic mass on CT   Generalized weakness   Chronic anticoagulation   AKI (acute kidney injury) (HCC)   Leukocytosis   Sepsis (HCC)   Lactic acidosis   Cancer (HCC)   Megan Becker is a 86 y.o. female with medical history significant for  CAD, mechanical heart valve secondary to rheumatic heart disease on Coumadin, HTN, A. fib, diastolic heart failure who presents to the emergency room by EMS initially as a code STEMI which was ruled out by cardiologist, Dr. Okey Dupre, who complains of a 1 month history of generalized weakness with poor appetite and poor oral intake as well as 1 day history of nonbloody nonbilious vomiting without coffee grounds and episodes of several episodes of diarrhea.  She reports mid back pain of moderate to severe intensity that radiates to the left shoulder, that is dull.       Lung metastasis (HCC)   Pancreatic mass on CT -imaging showing extensive lung metastases with possible pancreatic primary Plan: --oncology consult    Hyponatremia -Na improved with NS infusion, so likely due to volume depletion Plan: --cont NS@100  --q6h Na check, avoid Na increase >8 per 24 hours    Hypokalemia --replete with IV potassium (pt refused oral)  SIRS -Patient with vomiting and diarrhea, tachycardic and tachypneic, WBC 15,000 with lactic acid 3.2, procalcitonin 0.24.  No clear source of infection currently.   --CT chest didn't show consolidation and pt is not hypoxic, though pt was started on empiric PNA treatment with ceftriaxone and azithromycin -With GI symptoms, could be  gastroenteritis  --WBC trending up this morning Plan: --cont empiric abx    Lactic acidosis --3.2 on presentation, improved to 2.9 with IVF --cont MIVF    Atrial fibrillation (HCC) -Rate controlled.   --cont home warfarin per pharm dosing    CAD (coronary artery disease) -Troponin slightly elevated at 18->23, not significant. -Patient initially presented as a code STEMI but ruled out by cardiology    Chronic heart failure with preserved ejection fraction (HCC) -BNP elevated above 200 -Patient mostly hypovolemic but monitor for fluid overload in view of IV hydration    Hypertension -hold amlodipine    S/P MVR (mitral valve replacement) on warfarin -Pharmacy for warfarin management    AKI (acute kidney injury) (HCC) -Creatinine 1.65 above baseline of 0.8 a year ago -Prerenal related to fluid losses from diarrhea Plan: --cont NS@100    DVT prophylaxis: ON:[id number] Code Status: Full code  Family Communication: husband updated at bedside today Status is: inpatient Dispo:   The patient is from: home Anticipated d/c is to: home Anticipated d/c date is: 2-3 days Patient currently is not medically stable to d/c due to: hyponatremia on IVF, oncology consult pending   Subjective and Interval History:  Pt reported pain in her lower back/buttocks.  Not eating well.  Some dyspnea.  Good urine output.   Objective: Vitals:   10/03/20 1030 10/03/20 1130 10/03/20 1200 10/03/20 1603  BP: (!) 107/51 (!) 104/57 111/62 133/68  Pulse: (!) 101 (!) 102 (!) 105 100  Resp: 18 (!) 22 20 17  Temp:    98.6 F (37 C)  TempSrc:    Oral  SpO2: 96% 95% 98% 99%  Height:        Intake/Output Summary (Last 24 hours) at 10/03/2020 1905 Last data filed at 10/03/2020 1417 Gross per 24 hour  Intake 100 ml  Output --  Net 100 ml   There were no vitals filed for this visit.  Examination:   Constitutional: NAD, AAOx3 HEENT: conjunctivae and lids normal, EOMI CV: No cyanosis.   RESP:  normal respiratory effort, on RA Extremities: No effusions, edema in BLE SKIN: warm, dry and intact.  Extensive bruising over abdomen. Neuro: II - XII grossly intact.   Psych: depressed mood and affect.  Appropriate judgement and reason   Data Reviewed: I have personally reviewed following labs and imaging studies  CBC: Recent Labs  Lab 10/02/20 1901 10/03/20 0452  WBC 15.3* 23.9*  NEUTROABS 12.3* 21.3*  HGB 14.3 12.1  HCT 39.9 34.2*  MCV 93.7 95.5  PLT 273 [id number]   Basic Metabolic Panel: Recent Labs  Lab 10/02/20 1901 10/03/20 0452 10/03/20 1302 10/03/20 1746  NA 124* 126* 127* 128*  K 2.9* 2.7*  --   --   CL 81* 89*  --   --   CO2 27 27  --   --   GLUCOSE 179* 123*  --   --   BUN 16 18  --   --   CREATININE 1.65* 1.76*  --   --   CALCIUM 8.3* 7.3*  --   --    GFR: CrCl cannot be calculated (Unknown ideal weight.). Liver Function Tests: Recent Labs  Lab 10/02/20 1901 10/03/20 0452  AST 35 42*  ALT 20 20  ALKPHOS 123 119  BILITOT 2.3* 1.6*  PROT 6.5 5.2*  ALBUMIN 3.5 2.6*   Recent Labs  Lab 10/02/20 1901  LIPASE 48   No results for input(s): AMMONIA in the last 168 hours. Coagulation Profile: Recent Labs  Lab 10/02/20 1901 10/03/20 0452  INR 2.3* 2.8*   Cardiac Enzymes: No results for input(s): CKTOTAL, CKMB, CKMBINDEX, TROPONINI in the last 168 hours. BNP (last 3 results) No results for input(s): PROBNP in the last 8760 hours. HbA1C: No results for input(s): HGBA1C in the last 72 hours. CBG: No results for input(s): GLUCAP in the last 168 hours. Lipid Profile: No results for input(s): CHOL, HDL, LDLCALC, TRIG, CHOLHDL, LDLDIRECT in the last 72 hours. Thyroid Function Tests: No results for input(s): TSH, T4TOTAL, FREET4, T3FREE, THYROIDAB in the last 72 hours. Anemia Panel: No results for input(s): VITAMINB12, FOLATE, FERRITIN, TIBC, IRON, RETICCTPCT in the last 72 hours. Sepsis Labs: Recent Labs  Lab 10/02/20 2017 10/02/20 2159  10/03/20 0452  PROCALCITON 0.24  --   --   LATICACIDVEN  --  3.2* 2.9*    Recent Results (from the past 240 hour(s))  SARS CORONAVIRUS 2 (TAT 6-24 HRS) Nasopharyngeal Nasopharyngeal Swab     Status: None   Collection Time: 10/01/20 11:10 AM   Specimen: Nasopharyngeal Swab  Result Value Ref Range Status   SARS Coronavirus 2 NEGATIVE NEGATIVE Final    Comment: (NOTE) SARS-CoV-2 target nucleic acids are NOT DETECTED.  The SARS-CoV-2 RNA is generally detectable in upper and lower respiratory specimens during the acute phase of infection. Negative results do not preclude SARS-CoV-2 infection, do not rule out co-infections with other pathogens, and should not be used as the sole basis for treatment or other patient management decisions. Negative results must be  combined with clinical observations, patient history, and epidemiological information. The expected result is Negative.  Fact Sheet for Patients: SugarRoll.be  Fact Sheet for Healthcare Providers: https://www.woods-mathews.com/  This test is not yet approved or cleared by the [place] FDA and  has been authorized for detection and/or diagnosis of SARS-CoV-2 by FDA under an Emergency Use Authorization (EUA). This EUA will remain  in effect (meaning this test can be used) for the duration of the COVID-19 declaration under Se ction 564(b)(1) of the Act, 21 U.S.C. section 360bbb-3(b)(1), unless the authorization is terminated or revoked sooner.  Performed at [facility], [place] [address] [place], [place] [address]   Blood culture (single)     Status: None (Preliminary result)   Collection Time: 10/02/20 10:11 PM   Specimen: BLOOD  Result Value Ref Range Status   Specimen Description BLOOD LEFT ANTECUBITAL  Final   Special Requests   Final    BOTTLES DRAWN AEROBIC AND ANAEROBIC Blood Culture results may not be optimal due to an excessive volume of blood received in culture  bottles   Culture  Setup Time   Final    Organism ID to follow [place] Performed at [facility], [address] [place], [place] [address]    Culture GRAM POSITIVE COCCI  Final   Report Status PENDING  Incomplete  Gastrointestinal Panel by PCR , Stool     Status: None   Collection Time: 10/03/20 10:00 AM   Specimen: Stool  Result Value Ref Range Status   Campylobacter species NOT DETECTED NOT DETECTED Final   Plesimonas shigelloides NOT DETECTED NOT DETECTED Final   Salmonella species NOT DETECTED NOT DETECTED Final   Yersinia enterocolitica NOT DETECTED NOT DETECTED Final   Vibrio species NOT DETECTED NOT DETECTED Final   Vibrio cholerae NOT DETECTED NOT DETECTED Final   Enteroaggregative E coli (EAEC) NOT DETECTED NOT DETECTED Final   Enteropathogenic E coli (EPEC) NOT DETECTED NOT DETECTED Final   Enterotoxigenic E coli (ETEC) NOT DETECTED NOT DETECTED Final   Shiga like toxin producing E coli (STEC) NOT DETECTED NOT DETECTED Final   Shigella/Enteroinvasive E coli (EIEC) NOT DETECTED NOT DETECTED Final   Cryptosporidium NOT DETECTED NOT DETECTED Final   Cyclospora cayetanensis NOT DETECTED NOT DETECTED Final   Entamoeba histolytica NOT DETECTED NOT DETECTED Final   Giardia lamblia NOT DETECTED NOT DETECTED Final   Adenovirus F40/41 NOT DETECTED NOT DETECTED Final   Astrovirus NOT DETECTED NOT DETECTED Final   Norovirus GI/GII NOT DETECTED NOT DETECTED Final   Rotavirus A NOT DETECTED NOT DETECTED Final   Sapovirus (I, II, IV, and V) NOT DETECTED NOT DETECTED Final    Comment: Performed at [facility], [address] [place], [place] [address]  C Difficile Quick Screen w PCR reflex     Status: None   Collection Time: 10/03/20 10:00 AM   Specimen: Stool  Result Value Ref Range Status   C Diff antigen NEGATIVE NEGATIVE Final   C Diff toxin NEGATIVE NEGATIVE Final   C Diff interpretation No C. difficile detected.  Final     Comment: Performed at [facility], [address] [place], [place] [address]  Culture, blood (Routine X 2) w Reflex to ID Panel     Status: None (Preliminary result)   Collection Time: 10/03/20 12:54 PM   Specimen: BLOOD  Result Value Ref Range Status   Specimen Description BLOOD RIGHT ANTECUBITAL  Final   Special  Requests   Final    BOTTLES DRAWN AEROBIC AND ANAEROBIC Blood Culture adequate volume   Culture  Setup Time   Final    Organism ID to follow GRAM POSITIVE COCCI AEROBIC BOTTLE ONLY Performed at [facility], [address] [place], [place] [address]    Culture [facility] POSITIVE COCCI  Final   Report Status PENDING  Incomplete      Radiology Studies: DG Chest Portable 1 View  Result Date: 10/02/2020 CLINICAL DATA:  Back pain, weakness EXAM: PORTABLE CHEST 1 VIEW COMPARISON:  None. FINDINGS: Diffuse bilateral nodular areas of consolidation. No significant pleural effusion. No pneumothorax. Cardiomediastinal contours are likely within normal limits for technique. Calcified plaque along the thoracic aorta. IMPRESSION: Diffuse bilateral nodular areas of consolidation. Chest CT recommended for further evaluation. Electronically Signed   By: Macy Mis M.D.   On: 10/02/2020 19:41   CT CHEST ABDOMEN PELVIS WO CONTRAST  Result Date: 10/02/2020 CLINICAL DATA:  Abdominal distension. Low back pain. Pain status post fall. EXAM: CT CHEST, ABDOMEN AND PELVIS WITHOUT CONTRAST TECHNIQUE: Multidetector CT imaging of the chest, abdomen and pelvis was performed following the standard protocol without IV contrast. COMPARISON:  None. FINDINGS: CT CHEST FINDINGS Cardiovascular: The heart is enlarged. There are coronary artery calcifications. There are atherosclerotic changes of the thoracic aorta. There is no evidence for an aneurysm or large pleural effusion. Mediastinum/Nodes: --there are pathologically enlarged mediastinal lymph nodes. For example there is a 3.1 cm partially  calcified lymph node in the AP window (axial series 2, image 27). -- No hilar lymphadenopathy. -- No axillary lymphadenopathy. -- No supraclavicular lymphadenopathy. -- Normal thyroid gland where visualized. -  Unremarkable esophagus. Lungs/Pleura: There are innumerable pulmonary nodules throughout the bilateral lung fields. The largest is located in the right lower lobe and measures approximately 3.1 cm. There is no pneumothorax. There is no large pleural effusion. Musculoskeletal: There is no acute displaced fracture. There is a sclerotic lesion involving the posterior T10 vertebral body. CT ABDOMEN PELVIS FINDINGS Hepatobiliary: The liver is somewhat cirrhotic in appearance. There is layering hyperdense debris within the gallbladder which may represent sludge or small stones.There is no biliary ductal dilation. Pancreas: There is a questionable masslike area at the level of the pancreatic body. The distal pancreatic body and tail are somewhat atrophic. This is not well evaluated in the absence of IV contrast (axial series 2, image 58). Spleen: Unremarkable. Adrenals/Urinary Tract: --Adrenal glands: Unremarkable. --Right kidney/ureter: No hydronephrosis or radiopaque kidney stones. --Left kidney/ureter: No hydronephrosis or radiopaque kidney stones. --Urinary bladder: Unremarkable. Stomach/Bowel: --Stomach/Duodenum: No hiatal hernia or other gastric abnormality. Normal duodenal course and caliber. --Small bowel: Unremarkable. --Colon: There is scattered colonic diverticula without CT evidence for diverticulitis. There is questionable mild wall thickening of the ascending colon. --Appendix: Normal. Vascular/Lymphatic: Atherosclerotic calcification is present within the non-aneurysmal abdominal aorta, without hemodynamically significant stenosis. --there is mild retroperitoneal adenopathy. --No mesenteric lymphadenopathy. --No pelvic or inguinal lymphadenopathy. Reproductive: Unremarkable Other: There is a small  volume of abdominal ascites. Multiple peritoneal implants and omental caking is noted. Musculoskeletal. No acute displaced fractures. Multiple pockets of subcutaneous gas are noted in the anterior abdominal wall, likely related to subcutaneous injections. IMPRESSION: 1. Findings are consistent with a metastatic process as evidence by innumerable pulmonary nodules throughout both lung fields in addition to findings concerning for omental caking and peritoneal implants within the abdomen. The primary lesion is not clearly identified, however there is a questionable masslike area involving the pancreatic body that  is not well characterized in the absence of IV contrast. 2. Small volume ascites in the abdomen and pelvis. 3. Additional chronic findings as detailed above. Electronically Signed   By: Constance Holster M.D.   On: 10/02/2020 22:14     Scheduled Meds: . warfarin  2 mg Oral ONCE-1600  . Warfarin - Pharmacist Dosing Inpatient   Does not apply q1600   Continuous Infusions: . sodium chloride 100 mL (10/03/20 0330)  . azithromycin Stopped (10/03/20 0100)  . cefTRIAXone (ROCEPHIN)  IV Stopped (10/03/20 0103)  . potassium chloride    . sodium chloride       LOS: 1 day     Enzo Bi, MD [facility] If 7PM-7AM, please contact night-coverage 10/03/2020, 7:05 PM

## 2020-10-03 NOTE — Consult Note (Signed)
  PHARMACY - PHYSICIAN COMMUNICATION CRITICAL VALUE ALERT - BLOOD CULTURE IDENTIFICATION (BCID)  No results found for this or any previous visit.  Name of physician (or Provider) Contacted: Dr. Para March  Changes to prescribed antibiotics required:  Staph epi with no resistance patterns in 1/2 bottles (aerobic only). Patient is currently covered with ceftriaxone. Although cefazolin preferred regimen if suspected true infection and only source of infection in this patient.   Sharen Hones, PharmD, BCPS Clinical Pharmacist  10/03/2020  8:19 PM

## 2020-10-03 NOTE — ED Notes (Signed)
Called pharmacy. They will re-schedule the ordered potassium in so it can be pulled from pyxis and administered this morning. Was not given last night at 0030 when scheduled.

## 2020-10-03 NOTE — ED Notes (Signed)
Wrote to attending to ask for potassium in to be retimed because was not administered by night nurse.

## 2020-10-03 NOTE — Progress Notes (Signed)
Potassium IV x 3 doses, not done during the night. Reordered for 3 doses today

## 2020-10-03 NOTE — Progress Notes (Signed)
Notified bedside nurse of need to draw lactic acid.  

## 2020-10-03 NOTE — ED Notes (Signed)
Family at bedside. Peri care provided, stool sample obtained, purewick placed. Breakfast tary at bedside. Potassium turned to 46mL/hr because pt c/o intolerable burning above IV site.

## 2020-10-03 NOTE — ED Notes (Signed)
Pt offered breakfast tray but declined at this time. Tray placed on bedside table.

## 2020-10-03 NOTE — Consult Note (Signed)
ANTICOAGULATION CONSULT NOTE - Consult  Pharmacy Consult for Warfarin dosing Indication: Mechanical Heart Valve 2/2 rheumatic heart disease and AFib  No Known Allergies  Patient Measurements: Height: 5\' 3"  (160 cm) IBW/kg (Calculated) : 52.4  Vital Signs: BP: 114/61 (01/05 0700) Pulse Rate: 93 (01/05 0700)  Labs: Recent Labs    10/02/20 1901 10/02/20 2159 10/03/20 0452  HGB 14.3  --  12.1  HCT 39.9  --  34.2*  PLT 273  --  210  APTT 58*  --  48*  LABPROT 24.8*  --  28.5*  INR 2.3*  --  2.8*  CREATININE 1.65*  --  1.76*  TROPONINIHS 18* 23*  --     CrCl cannot be calculated (Unknown ideal weight.).   Medications:  No AC relevant Drug allergies Current IP: azithromycin & ceftriaxone interacting  Assessment: 85 yo F with PMH significant for CAD, [place] Mechanical Mitral Heart Valve 2/2 rheumatic heart disease, Afib, HTN, dCHF, & HLD presenting with  ACS event, 1 month of generalized weakness with poor appetite, and 1 day emesis and Dh.  PTA patient was taking 2mg  Sun/Wed/Fri & 3mg  Tue/Thu/Sat skipping Monday and targeting INR goal of 2.5-3.5. Warfarin originally held in anticipation of endoscopy scheduled for 1/5, however in current event will resume warfarin dosing per consult.  Date Time INR Comment 1/04 1901 2.3  1/05 0452 2.8   Goal of Therapy:  INR: 2.5-3.5 (mechanical valve) Monitor platelets by anticoagulation protocol: Yes   Plan:  Patient's INR is 2.8 on 1/05 @ 0452 in goal INR range. Will resume dosing per home dose at 2mg  x1 tonight. Monitor with daily INR closely while on azithromycin/ceftriaxone.  3/04 Brittiany Wiehe 10/03/2020,9:52 AM

## 2020-10-04 ENCOUNTER — Ambulatory Visit: Payer: Medicare HMO

## 2020-10-04 DIAGNOSIS — C7800 Secondary malignant neoplasm of unspecified lung: Secondary | ICD-10-CM

## 2020-10-04 DIAGNOSIS — R634 Abnormal weight loss: Secondary | ICD-10-CM

## 2020-10-04 DIAGNOSIS — R590 Localized enlarged lymph nodes: Secondary | ICD-10-CM

## 2020-10-04 DIAGNOSIS — C7801 Secondary malignant neoplasm of right lung: Secondary | ICD-10-CM

## 2020-10-04 DIAGNOSIS — R630 Anorexia: Secondary | ICD-10-CM

## 2020-10-04 DIAGNOSIS — C786 Secondary malignant neoplasm of retroperitoneum and peritoneum: Secondary | ICD-10-CM

## 2020-10-04 DIAGNOSIS — C7802 Secondary malignant neoplasm of left lung: Secondary | ICD-10-CM

## 2020-10-04 DIAGNOSIS — R531 Weakness: Secondary | ICD-10-CM

## 2020-10-04 LAB — CBC
HCT: 30.7 % — ABNORMAL LOW (ref 36.0–46.0)
Hemoglobin: 11.1 g/dL — ABNORMAL LOW (ref 12.0–15.0)
MCH: 34.2 pg — ABNORMAL HIGH (ref 26.0–34.0)
MCHC: 36.2 g/dL — ABNORMAL HIGH (ref 30.0–36.0)
MCV: 94.5 fL (ref 80.0–100.0)
Platelets: 180 10*3/uL (ref 150–400)
RBC: 3.25 MIL/uL — ABNORMAL LOW (ref 3.87–5.11)
RDW: 12.8 % (ref 11.5–15.5)
WBC: 24.1 10*3/uL — ABNORMAL HIGH (ref 4.0–10.5)
nRBC: 0 % (ref 0.0–0.2)

## 2020-10-04 LAB — BASIC METABOLIC PANEL
Anion gap: 10 (ref 5–15)
BUN: 18 mg/dL (ref 8–23)
CO2: 25 mmol/L (ref 22–32)
Calcium: 6.8 mg/dL — ABNORMAL LOW (ref 8.9–10.3)
Chloride: 94 mmol/L — ABNORMAL LOW (ref 98–111)
Creatinine, Ser: 1.2 mg/dL — ABNORMAL HIGH (ref 0.44–1.00)
GFR, Estimated: 45 mL/min — ABNORMAL LOW (ref >60)
Glucose, Bld: 122 mg/dL — ABNORMAL HIGH (ref 70–99)
Potassium: 3 mmol/L — ABNORMAL LOW (ref 3.5–5.1)
Sodium: 129 mmol/L — ABNORMAL LOW (ref 135–145)

## 2020-10-04 LAB — PROTIME-INR
INR: 3.3 — ABNORMAL HIGH (ref 0.8–1.2)
Prothrombin Time: 32.6 seconds — ABNORMAL HIGH (ref 11.4–15.2)

## 2020-10-04 LAB — SODIUM
Sodium: 126 mmol/L — ABNORMAL LOW (ref 135–145)
Sodium: 128 mmol/L — ABNORMAL LOW (ref 135–145)
Sodium: 128 mmol/L — ABNORMAL LOW (ref 135–145)

## 2020-10-04 LAB — MAGNESIUM: Magnesium: 1.4 mg/dL — ABNORMAL LOW (ref 1.7–2.4)

## 2020-10-04 LAB — PROCALCITONIN: Procalcitonin: 3.49 ng/mL

## 2020-10-04 MED ORDER — POTASSIUM CHLORIDE 10 MEQ/100ML IV SOLN
10.0000 meq | INTRAVENOUS | Status: AC
  Administered 2020-10-04 (×4): 10 meq via INTRAVENOUS
  Filled 2020-10-04 (×3): qty 100

## 2020-10-04 MED ORDER — ASPIRIN EC 81 MG PO TBEC
81.0000 mg | DELAYED_RELEASE_TABLET | Freq: Every day | ORAL | Status: DC
  Administered 2020-10-04 – 2020-10-06 (×3): 81 mg via ORAL
  Filled 2020-10-04 (×3): qty 1

## 2020-10-04 MED ORDER — PANTOPRAZOLE SODIUM 40 MG PO TBEC
40.0000 mg | DELAYED_RELEASE_TABLET | Freq: Two times a day (BID) | ORAL | Status: DC
  Administered 2020-10-04 – 2020-10-06 (×5): 40 mg via ORAL
  Filled 2020-10-04 (×5): qty 1

## 2020-10-04 MED ORDER — WARFARIN SODIUM 2 MG PO TABS
2.0000 mg | ORAL_TABLET | Freq: Once | ORAL | Status: AC
  Administered 2020-10-04: 2 mg via ORAL
  Filled 2020-10-04: qty 1

## 2020-10-04 MED ORDER — METOPROLOL TARTRATE 50 MG PO TABS
100.0000 mg | ORAL_TABLET | Freq: Two times a day (BID) | ORAL | Status: DC
  Administered 2020-10-04 – 2020-10-06 (×5): 100 mg via ORAL
  Filled 2020-10-04 (×6): qty 2

## 2020-10-04 MED ORDER — ATORVASTATIN CALCIUM 10 MG PO TABS
10.0000 mg | ORAL_TABLET | Freq: Every evening | ORAL | Status: DC
  Administered 2020-10-04 – 2020-10-05 (×2): 10 mg via ORAL
  Filled 2020-10-04 (×2): qty 1

## 2020-10-04 MED ORDER — MAGNESIUM SULFATE 2 GM/50ML IV SOLN
2.0000 g | Freq: Once | INTRAVENOUS | Status: AC
  Administered 2020-10-04: 2 g via INTRAVENOUS
  Filled 2020-10-04: qty 50

## 2020-10-04 NOTE — Progress Notes (Signed)
PROGRESS NOTE    Megan Becker  [id number] DOB: [birth year] DOA: 10/02/2020 PCP: [facility] [place]:   Active Problems:   Atrial fibrillation (HCC)   CAD (coronary artery disease)   Chronic heart failure with preserved ejection fraction (HCC)   Hypertension   S/P MVR (mitral valve replacement)   Lung metastasis (HCC)   Hyponatremia   Hypokalemia   Pancreatic mass on CT   Generalized weakness   Chronic anticoagulation   AKI (acute kidney injury) ([place])   Leukocytosis   Sepsis (HCC)   Lactic acidosis   Cancer (HCC)   Megan Becker is a 86 y.o. female with medical history significant for  CAD, mechanical heart valve secondary to rheumatic heart disease on Coumadin, HTN, A. fib, diastolic heart failure who presents to the emergency room by EMS initially as a code STEMI which was ruled out by cardiologist, Dr. Saunders Revel, who complains of a 1 month history of generalized weakness with poor appetite and poor oral intake as well as 1 day history of nonbloody nonbilious vomiting without coffee grounds and episodes of several episodes of diarrhea.  She reports mid back pain of moderate to severe intensity that radiates to the left shoulder, that is dull.       Lung metastasis ([place])   Pancreatic mass on CT -imaging showing extensive lung metastases with possible pancreatic primary Plan: --oncology consult today, Dr. Janese Banks --Pt and family to consider biopsy --oncology palliative care provider to see pt tomorrow --Hospice referral tomorrow    Hyponatremia -Na 124 on presentation, improved with NS infusion, however, stabilized around 128.   Plan: --cont NS@50     Hypokalemia --replete with IV potassium  SIRS -Patient with vomiting and diarrhea, tachycardic and tachypneic, WBC 15,000 with lactic acid 3.2, No clear source of infection currently.   --CT chest didn't show consolidation and pt is not hypoxic, though pt was started on empiric PNA treatment with  ceftriaxone and azithromycin -With GI symptoms, could be gastroenteritis  --WBC trending up this morning --procalcitonin 0.24 on presentation, increased to 3.49 today despite being on abx Plan: --cont empiric abx    Lactic acidosis --3.2 on presentation, improved to 2.9 with IVF --cont MIVF    Atrial fibrillation ([place]) -Rate controlled.   --resume home metop --cont home warfarin per pharm dosing    CAD (coronary artery disease) -Troponin slightly elevated at 18->23, not significant. -Patient initially presented as a code STEMI but ruled out by cardiology    Chronic heart failure with preserved ejection fraction (HCC) -BNP elevated above 200 -Patient mostly hypovolemic but monitor for fluid overload in view of IV hydration  Hx of  Hypertension -BP intermittently low --hold amlodipine --resume metop    S/P MVR (mitral valve replacement) on warfarin -Pharmacy for warfarin management    AKI (acute kidney injury) ([place]) -Creatinine 1.65 above baseline of 0.8 a year ago -Prerenal related to fluid losses from diarrhea Plan: --cont NS@50    DVT prophylaxis: [id number] Code Status: Full code  Family Communication: daughter updated at bedside today Status is: inpatient Dispo:   The patient is from: home Anticipated d/c is to: home Anticipated d/c date is: 1-2 days Patient currently is not medically stable to d/c due to: pending palliative care and hospice    Subjective and Interval History:  Pt reported she was mentally not well.  No N/V/D.  Not eating much.   Objective: Vitals:   10/04/20 1031 10/04/20 1100 10/04/20 1159 10/04/20 1607  BP: (!) 105/51 126/72 123/71 134/84  Pulse: 91 93 68 96  Resp: 16 17 17 17   Temp:   97.8 F (36.6 C) 98.7 F (37.1 C)  TempSrc:      SpO2:  99% 100% 100%  Height:        Intake/Output Summary (Last 24 hours) at 10/04/2020 1831 Last data filed at 10/04/2020 1430 Gross per 24 hour  Intake 240 ml  Output 500 ml  Net -260 ml    There were no vitals filed for this visit.  Examination:   Constitutional: NAD, AAOx3 HEENT: conjunctivae and lids normal, EOMI CV: No cyanosis.   RESP: normal respiratory effort, on RA Extremities: No effusions, edema in BLE SKIN: warm, dry and intact Neuro: II - XII grossly intact.   Psych: depressed mood and affect.     Data Reviewed: I have personally reviewed following labs and imaging studies  CBC: Recent Labs  Lab 10/02/20 1901 10/03/20 0452 10/04/20 0314  WBC 15.3* 23.9* 24.1*  NEUTROABS 12.3* 21.3*  --   HGB 14.3 12.1 11.1*  HCT 39.9 34.2* 30.7*  MCV 93.7 95.5 94.5  PLT 273 210 [id number]   Basic Metabolic Panel: Recent Labs  Lab 10/02/20 1901 10/03/20 0452 10/03/20 1302 10/03/20 1746 10/03/20 2352 10/04/20 0314 10/04/20 0651 10/04/20 1217  NA 124* 126*   < > 128* 126* 129* 128* 128*  K 2.9* 2.7*  --   --   --  3.0*  --   --   CL 81* 89*  --   --   --  94*  --   --   CO2 27 27  --   --   --  25  --   --   GLUCOSE 179* 123*  --   --   --  122*  --   --   BUN 16 18  --   --   --  18  --   --   CREATININE 1.65* 1.76*  --   --   --  1.20*  --   --   CALCIUM 8.3* 7.3*  --   --   --  6.8*  --   --   MG  --   --   --   --   --  1.4*  --   --    < > = values in this interval not displayed.   GFR: CrCl cannot be calculated (Unknown ideal weight.). Liver Function Tests: Recent Labs  Lab 10/02/20 1901 10/03/20 0452  AST 35 42*  ALT 20 20  ALKPHOS 123 119  BILITOT 2.3* 1.6*  PROT 6.5 5.2*  ALBUMIN 3.5 2.6*   Recent Labs  Lab 10/02/20 1901  LIPASE 48   No results for input(s): AMMONIA in the last 168 hours. Coagulation Profile: Recent Labs  Lab 10/02/20 1901 10/03/20 0452 10/04/20 0314  INR 2.3* 2.8* 3.3*   Cardiac Enzymes: No results for input(s): CKTOTAL, CKMB, CKMBINDEX, TROPONINI in the last 168 hours. BNP (last 3 results) No results for input(s): PROBNP in the last 8760 hours. HbA1C: No results for input(s): HGBA1C in the last 72  hours. CBG: No results for input(s): GLUCAP in the last 168 hours. Lipid Profile: No results for input(s): CHOL, HDL, LDLCALC, TRIG, CHOLHDL, LDLDIRECT in the last 72 hours. Thyroid Function Tests: No results for input(s): TSH, T4TOTAL, FREET4, T3FREE, THYROIDAB in the last 72 hours. Anemia Panel: No results for input(s): VITAMINB12, FOLATE, FERRITIN, TIBC, IRON, RETICCTPCT in the last  72 hours. Sepsis Labs: Recent Labs  Lab 10/02/20 2017 10/02/20 2159 10/03/20 0452 10/04/20 1217  PROCALCITON 0.24  --   --  3.49  LATICACIDVEN  --  3.2* 2.9*  --     Recent Results (from the past 240 hour(s))  SARS CORONAVIRUS 2 (TAT 6-24 HRS) Nasopharyngeal Nasopharyngeal Swab     Status: None   Collection Time: 10/01/20 11:10 AM   Specimen: Nasopharyngeal Swab  Result Value Ref Range Status   SARS Coronavirus 2 NEGATIVE NEGATIVE Final    Comment: (NOTE) SARS-CoV-2 target nucleic acids are NOT DETECTED.  The SARS-CoV-2 RNA is generally detectable in upper and lower respiratory specimens during the acute phase of infection. Negative results do not preclude SARS-CoV-2 infection, do not rule out co-infections with other pathogens, and should not be used as the sole basis for treatment or other patient management decisions. Negative results must be combined with clinical observations, patient history, and epidemiological information. The expected result is Negative.  Fact Sheet for Patients: HairSlick.no  Fact Sheet for Healthcare Providers: quierodirigir.com  This test is not yet approved or cleared by the [place] FDA and  has been authorized for detection and/or diagnosis of SARS-CoV-2 by FDA under an Emergency Use Authorization (EUA). This EUA will remain  in effect (meaning this test can be used) for the duration of the COVID-19 declaration under Se ction 564(b)(1) of the Act, 21 U.S.C. section 360bbb-3(b)(1), unless the  authorization is terminated or revoked sooner.  Performed at [facility], [address], [place], [place] [address]   Blood culture (single)     Status: Abnormal (Preliminary result)   Collection Time: 10/02/20 10:11 PM   Specimen: BLOOD  Result Value Ref Range Status   Specimen Description   Final    BLOOD LEFT ANTECUBITAL Performed at [facility], [address] [place], [place] [address]    Special Requests   Final    BOTTLES DRAWN AEROBIC AND ANAEROBIC Blood Culture results may not be optimal due to an excessive volume of blood received in culture bottles Performed at [facility], [address] [place], [place] [address]    Culture  Setup Time   Final    Organism ID to follow GRAM POSITIVE COCCI AEROBIC BOTTLE ONLY CRITICAL RESULT CALLED TO, READ BACK BY AND VERIFIED WITH: Lovie Chol 2001 10/03/2020 DB Performed at [facility], [address] [place], [place] [address]    Culture (A)  Final    STAPHYLOCOCCUS EPIDERMIDIS THE SIGNIFICANCE OF ISOLATING THIS ORGANISM FROM A SINGLE SET OF BLOOD CULTURES WHEN MULTIPLE SETS ARE DRAWN IS UNCERTAIN. PLEASE NOTIFY THE MICROBIOLOGY DEPARTMENT WITHIN ONE WEEK IF SPECIATION AND SENSITIVITIES ARE REQUIRED. Performed at [facility], [address], [place], [place] [address]    Report Status PENDING  Incomplete  Blood Culture ID Panel (Reflexed)     Status: Abnormal   Collection Time: 10/02/20 10:11 PM  Result Value Ref Range Status   Enterococcus faecalis NOT DETECTED NOT DETECTED Final   Enterococcus Faecium NOT DETECTED NOT DETECTED Final   Listeria monocytogenes NOT DETECTED NOT DETECTED Final   Staphylococcus species DETECTED (A) NOT DETECTED Final    Comment: CRITICAL RESULT CALLED TO, READ BACK BY AND VERIFIED WITH: CLARISSA DOLAN, PHAR 2001 10/03/2020 DB    Staphylococcus aureus (BCID) NOT DETECTED NOT DETECTED Final   Staphylococcus epidermidis DETECTED (A) NOT DETECTED  Final    Comment: CRITICAL RESULT CALLED TO, READ BACK BY  AND VERIFIED WITH: Raliegh Scarlet, PHAR 2001 10/03/2020 DB    Staphylococcus lugdunensis NOT DETECTED NOT DETECTED Final   Streptococcus species NOT DETECTED NOT DETECTED Final   Streptococcus agalactiae NOT DETECTED NOT DETECTED Final   Streptococcus pneumoniae NOT DETECTED NOT DETECTED Final   Streptococcus pyogenes NOT DETECTED NOT DETECTED Final   A.calcoaceticus-baumannii NOT DETECTED NOT DETECTED Final   Bacteroides fragilis NOT DETECTED NOT DETECTED Final   Enterobacterales NOT DETECTED NOT DETECTED Final   Enterobacter cloacae complex NOT DETECTED NOT DETECTED Final   Escherichia coli NOT DETECTED NOT DETECTED Final   Klebsiella aerogenes NOT DETECTED NOT DETECTED Final   Klebsiella oxytoca NOT DETECTED NOT DETECTED Final   Klebsiella pneumoniae NOT DETECTED NOT DETECTED Final   Proteus species NOT DETECTED NOT DETECTED Final   Salmonella species NOT DETECTED NOT DETECTED Final   Serratia marcescens NOT DETECTED NOT DETECTED Final   Haemophilus influenzae NOT DETECTED NOT DETECTED Final   Neisseria meningitidis NOT DETECTED NOT DETECTED Final   Pseudomonas aeruginosa NOT DETECTED NOT DETECTED Final   Stenotrophomonas maltophilia NOT DETECTED NOT DETECTED Final   Candida albicans NOT DETECTED NOT DETECTED Final   Candida auris NOT DETECTED NOT DETECTED Final   Candida glabrata NOT DETECTED NOT DETECTED Final   Candida krusei NOT DETECTED NOT DETECTED Final   Candida parapsilosis NOT DETECTED NOT DETECTED Final   Candida tropicalis NOT DETECTED NOT DETECTED Final   Cryptococcus neoformans/gattii NOT DETECTED NOT DETECTED Final   Methicillin resistance mecA/C NOT DETECTED NOT DETECTED Final    Comment: Performed at [facility], [address] [place], [place] [address]  Gastrointestinal Panel by PCR , Stool     Status: None   Collection Time: 10/03/20 10:00 AM   Specimen: Stool  Result Value Ref Range Status    Campylobacter species NOT DETECTED NOT DETECTED Final   Plesimonas shigelloides NOT DETECTED NOT DETECTED Final   Salmonella species NOT DETECTED NOT DETECTED Final   Yersinia enterocolitica NOT DETECTED NOT DETECTED Final   Vibrio species NOT DETECTED NOT DETECTED Final   Vibrio cholerae NOT DETECTED NOT DETECTED Final   Enteroaggregative E coli (EAEC) NOT DETECTED NOT DETECTED Final   Enteropathogenic E coli (EPEC) NOT DETECTED NOT DETECTED Final   Enterotoxigenic E coli (ETEC) NOT DETECTED NOT DETECTED Final   Shiga like toxin producing E coli (STEC) NOT DETECTED NOT DETECTED Final   Shigella/Enteroinvasive E coli (EIEC) NOT DETECTED NOT DETECTED Final   Cryptosporidium NOT DETECTED NOT DETECTED Final   Cyclospora cayetanensis NOT DETECTED NOT DETECTED Final   Entamoeba histolytica NOT DETECTED NOT DETECTED Final   Giardia lamblia NOT DETECTED NOT DETECTED Final   Adenovirus F40/41 NOT DETECTED NOT DETECTED Final   Astrovirus NOT DETECTED NOT DETECTED Final   Norovirus GI/GII NOT DETECTED NOT DETECTED Final   Rotavirus A NOT DETECTED NOT DETECTED Final   Sapovirus (I, II, IV, and V) NOT DETECTED NOT DETECTED Final    Comment: Performed at [facility], [address] [place], [place] [address]  C Difficile Quick Screen w PCR reflex     Status: None   Collection Time: 10/03/20 10:00 AM   Specimen: Stool  Result Value Ref Range Status   C Diff antigen NEGATIVE NEGATIVE Final   C Diff toxin NEGATIVE NEGATIVE Final   C Diff interpretation No C. difficile detected.  Final    Comment: Performed at [facility], [address] [place],  [address]  Culture, blood (Routine X 2)  w Reflex to ID Panel     Status: None (Preliminary result)   Collection Time: 10/03/20 12:54 PM   Specimen: BLOOD  Result Value Ref Range Status   Specimen Description BLOOD RIGHT ANTECUBITAL  Final   Special Requests   Final    BOTTLES DRAWN AEROBIC AND ANAEROBIC Blood Culture  adequate volume   Culture  Setup Time PENDING  Incomplete   Culture   Final    NO GROWTH < 24 HOURS Performed at [facility], [address] [place], [place] [address]    Report Status PENDING  Incomplete      Radiology Studies: DG Chest Portable 1 View  Result Date: 10/02/2020 CLINICAL DATA:  Back pain, weakness EXAM: PORTABLE CHEST 1 VIEW COMPARISON:  None. FINDINGS: Diffuse bilateral nodular areas of consolidation. No significant pleural effusion. No pneumothorax. Cardiomediastinal contours are likely within normal limits for technique. Calcified plaque along the thoracic aorta. IMPRESSION: Diffuse bilateral nodular areas of consolidation. Chest CT recommended for further evaluation. Electronically Signed   By: Macy Mis M.D.   On: 10/02/2020 19:41   CT CHEST ABDOMEN PELVIS WO CONTRAST  Result Date: 10/02/2020 CLINICAL DATA:  Abdominal distension. Low back pain. Pain status post fall. EXAM: CT CHEST, ABDOMEN AND PELVIS WITHOUT CONTRAST TECHNIQUE: Multidetector CT imaging of the chest, abdomen and pelvis was performed following the standard protocol without IV contrast. COMPARISON:  None. FINDINGS: CT CHEST FINDINGS Cardiovascular: The heart is enlarged. There are coronary artery calcifications. There are atherosclerotic changes of the thoracic aorta. There is no evidence for an aneurysm or large pleural effusion. Mediastinum/Nodes: --there are pathologically enlarged mediastinal lymph nodes. For example there is a 3.1 cm partially calcified lymph node in the AP window (axial series 2, image 27). -- No hilar lymphadenopathy. -- No axillary lymphadenopathy. -- No supraclavicular lymphadenopathy. -- Normal thyroid gland where visualized. -  Unremarkable esophagus. Lungs/Pleura: There are innumerable pulmonary nodules throughout the bilateral lung fields. The largest is located in the right lower lobe and measures approximately 3.1 cm. There is no pneumothorax. There is no large  pleural effusion. Musculoskeletal: There is no acute displaced fracture. There is a sclerotic lesion involving the posterior T10 vertebral body. CT ABDOMEN PELVIS FINDINGS Hepatobiliary: The liver is somewhat cirrhotic in appearance. There is layering hyperdense debris within the gallbladder which may represent sludge or small stones.There is no biliary ductal dilation. Pancreas: There is a questionable masslike area at the level of the pancreatic body. The distal pancreatic body and tail are somewhat atrophic. This is not well evaluated in the absence of IV contrast (axial series 2, image 58). Spleen: Unremarkable. Adrenals/Urinary Tract: --Adrenal glands: Unremarkable. --Right kidney/ureter: No hydronephrosis or radiopaque kidney stones. --Left kidney/ureter: No hydronephrosis or radiopaque kidney stones. --Urinary bladder: Unremarkable. Stomach/Bowel: --Stomach/Duodenum: No hiatal hernia or other gastric abnormality. Normal duodenal course and caliber. --Small bowel: Unremarkable. --Colon: There is scattered colonic diverticula without CT evidence for diverticulitis. There is questionable mild wall thickening of the ascending colon. --Appendix: Normal. Vascular/Lymphatic: Atherosclerotic calcification is present within the non-aneurysmal abdominal aorta, without hemodynamically significant stenosis. --there is mild retroperitoneal adenopathy. --No mesenteric lymphadenopathy. --No pelvic or inguinal lymphadenopathy. Reproductive: Unremarkable Other: There is a small volume of abdominal ascites. Multiple peritoneal implants and omental caking is noted. Musculoskeletal. No acute displaced fractures. Multiple pockets of subcutaneous gas are noted in the anterior abdominal wall, likely related to subcutaneous injections. IMPRESSION: 1. Findings are consistent with a metastatic process as evidence by innumerable pulmonary nodules throughout both  lung fields in addition to findings concerning for omental caking and  peritoneal implants within the abdomen. The primary lesion is not clearly identified, however there is a questionable masslike area involving the pancreatic body that is not well characterized in the absence of IV contrast. 2. Small volume ascites in the abdomen and pelvis. 3. Additional chronic findings as detailed above. Electronically Signed   By: Constance Holster M.D.   On: 10/02/2020 22:14     Scheduled Meds: . aspirin EC  81 mg Oral Daily  . atorvastatin  10 mg Oral QPM  . metoprolol tartrate  100 mg Oral BID  . pantoprazole  40 mg Oral BID  . Warfarin - Pharmacist Dosing Inpatient   Does not apply q1600   Continuous Infusions: . sodium chloride 50 mL/hr at 10/04/20 1322  . azithromycin 500 mg (10/03/20 2327)  . cefTRIAXone (ROCEPHIN)  IV 2 g (10/03/20 2228)  . sodium chloride       LOS: 2 days     Enzo Bi, MD [facility] If 7PM-7AM, please contact night-coverage 10/04/2020, 6:31 PM

## 2020-10-04 NOTE — Progress Notes (Signed)
   10/04/20 0741  Assess: MEWS Score  Temp 97.7 F (36.5 C)  BP (!) 132/100  Pulse Rate (!) 138  Resp 17  SpO2 100 %  O2 Device Room Air  Assess: MEWS Score  MEWS Temp 0  MEWS Systolic 0  MEWS Pulse 3  MEWS RR 0  MEWS LOC 0  MEWS Score 3  MEWS Score Color Yellow  Assess: if the MEWS score is Yellow or Red  Were vital signs taken at a resting state? Yes  Focused Assessment No change from prior assessment  Early Detection of Sepsis Score *See Row Information* Medium  MEWS guidelines implemented *See Row Information* Yes  Treat  MEWS Interventions Other (Comment) (notify MD)  Pain Scale 0-10  Pain Score 0  Take Vital Signs  Increase Vital Sign Frequency  Yellow: Q 2hr X 2 then Q 4hr X 2, if remains yellow, continue Q 4hrs  Escalate  MEWS: Escalate Yellow: discuss with charge nurse/RN and consider discussing with provider and RRT  Notify: Charge Nurse/RN  Name of Charge Nurse/RN Notified Anitra rn  Date Charge Nurse/RN Notified 10/04/20  Time Charge Nurse/RN Notified [id number]  Notify: Provider  Provider Name/Title Darlin Priestly  Date Provider Notified 10/04/20  Time Provider Notified [phone]  Notification Type Page  Notification Reason Other (Comment) (bp/ hr- need home bp meds ordered)  Date of Provider Response 10/04/20  Time of Provider Response [phone]  Inserted for Automatic Data

## 2020-10-04 NOTE — Consult Note (Signed)
Hematology/Oncology Consult note [facility] Telephone:([phone] [phone]  Patient Care Team: Center, [facility] as PCP - General (General Practice)   Name of the patient: Megan Becker  [id number]  03-Jun-1935    Reason for consult: Metastatic disease unknown primary possibly metastatic pancreatic cancer   Requesting physician: Dr. Enzo Bi  Date of visit: 10/04/2020    History of presenting illness- Patient is a 86 year old female with a past medical history significant for valvular heart disease for which she is on Coumadin diastolic heart failure A. fib hypertension who came into the hospital with symptoms of generalized weakness and poor appetite.  She has been slowly declining over the last 3 months.  She has had 2 UTIs in the last 3 months and significant weight loss.  Also had a fall episode at home and had significant bruising over her abdominal wall.  She was supposed to get an upper endoscopy to evaluate her symptoms of nausea and vomiting but presented to the ER before that.  She underwent CT chest abdomen and pelvis without contrast which showed pathologically enlarged mediastinal lymph nodes in the AP window.  Innumerable pulmonary nodules throughout the bilateral lung fields with the largest one measuring 3.1 cm.  Questionable masslike area at the level of pancreatic body.  Not well visualized on CT without IV contrast.  Small volume abdominal ascites.  Multiple peritoneal implants and omental caking.  Oncology consulted for further management of goals of care.  I met with the patient and her daughter at the bedside today.  Patient's daughter states that 3 months prior to admission patient was doing well and was independent of her ADLs.  However her condition has declined significantly in the last 3 months.  ECOG PS- 3  Pain scale- 0   Review of systems- Review of Systems  Constitutional: Positive for malaise/fatigue and weight  loss. Negative for chills and fever.  HENT: Negative for congestion, ear discharge and nosebleeds.   Eyes: Negative for blurred vision.  Respiratory: Negative for cough, hemoptysis, sputum production, shortness of breath and wheezing.   Cardiovascular: Negative for chest pain, palpitations, orthopnea and claudication.  Gastrointestinal: Positive for diarrhea, nausea and vomiting. Negative for abdominal pain, blood in stool, constipation, heartburn and melena.  Genitourinary: Negative for dysuria, flank pain, frequency, hematuria and urgency.  Musculoskeletal: Negative for back pain, joint pain and myalgias.  Skin: Negative for rash.  Neurological: Negative for dizziness, tingling, focal weakness, seizures, weakness and headaches.  Endo/Heme/Allergies: Does not bruise/bleed easily.  Psychiatric/Behavioral: Negative for depression and suicidal ideas. The patient does not have insomnia.     No Known Allergies  Patient Active Problem List   Diagnosis Date Noted  . Cancer ([place]) 10/03/2020  . Lung metastasis ([place]) 10/02/2020  . Hyponatremia 10/02/2020  . Hypokalemia 10/02/2020  . Pancreatic mass on CT 10/02/2020  . Generalized weakness 10/02/2020  . Chronic anticoagulation 10/02/2020  . AKI (acute kidney injury) ([place]) 10/02/2020  . Leukocytosis 10/02/2020  . Sepsis ([place]) 10/02/2020  . Lactic acidosis 10/02/2020  . Chronic heart failure with preserved ejection fraction ([place]) 09/07/2020  . S/P MVR (mitral valve replacement) 09/07/2020  . Atrial fibrillation ([place]) 11/19/2011  . CAD (coronary artery disease) 11/19/2011  . Hypertension 11/19/2011     Past Medical History:  Diagnosis Date  . Arrhythmia   . Asthma   . Cancer ([place])    skin  . CHF (congestive heart failure) ([place])   . Coronary artery disease   .  Dysrhythmia    atrial fibrillation  . GERD (gastroesophageal reflux disease)   . HOH (hard of hearing)   . Hx of basal cell carcinoma 07/2016   R zygoma at hair line, R  post. auricular neck  . Hypercholesteremia   . Hypertension   . Lower extremity edema   . Mitral valve replaced   . Osteopenia   . Osteoporosis   . Palpitations   . Rheumatic heart disease   . Vaginal bleeding      Past Surgical History:  Procedure Laterality Date  . CATARACT EXTRACTION W/PHACO Left 04/16/2015   Procedure: CATARACT EXTRACTION PHACO AND INTRAOCULAR LENS PLACEMENT (IOC);  Surgeon: Estill Cotta, MD;  Location: [facility] ORS;  Service: Ophthalmology;  Laterality: Left;  [place]: 00:58 AP:22.7 CDE:23.44 PACK LOT: [id number] H  . CERVICAL CONE BIOPSY    . COLONOSCOPY  2002  . mitral valve replaced      Social History   Socioeconomic History  . Marital status: Married    Spouse name: Gwyndolyn Saxon  . Number of children: Not on file  . Years of education: Not on file  . Highest education level: Not on file  Occupational History  . Not on file  Tobacco Use  . Smoking status: Never Smoker  . Smokeless tobacco: Never Used  Substance and Sexual Activity  . Alcohol use: No  . Drug use: Never  . Sexual activity: Not on file  Other Topics Concern  . Not on file  Social History Narrative  . Not on file   Social Determinants of Health   Financial Resource Strain: Not on file  Food Insecurity: Not on file  Transportation Needs: Not on file  Physical Activity: Not on file  Stress: Not on file  Social Connections: Not on file  Intimate Partner Violence: Not on file     History reviewed. No pertinent family history.   Current Facility-Administered Medications:  .  0.9 %  sodium chloride infusion, , Intravenous, Continuous, Enzo Bi, MD, Last Rate: 50 mL/hr at 10/04/20 1322, Rate Change at 10/04/20 1322 .  acetaminophen (TYLENOL) tablet 650 mg, 650 mg, Oral, Q6H PRN **OR** acetaminophen (TYLENOL) suppository 650 mg, 650 mg, Rectal, Q6H PRN, Athena Masse, MD .  aspirin EC tablet 81 mg, 81 mg, Oral, Daily, Enzo Bi, MD, 81 mg at 10/04/20 0924 .  atorvastatin (LIPITOR)  tablet 10 mg, 10 mg, Oral, QPM, Enzo Bi, MD, 10 mg at 10/04/20 1740 .  azithromycin (ZITHROMAX) 500 mg in sodium chloride 0.9 % 250 mL IVPB, 500 mg, Intravenous, Q24H, Carrie Mew, MD, Last Rate: 250 mL/hr at 10/03/20 2327, 500 mg at 10/03/20 2327 .  cefTRIAXone (ROCEPHIN) 2 g in sodium chloride 0.9 % 100 mL IVPB, 2 g, Intravenous, Q24H, Carrie Mew, MD, Last Rate: 200 mL/hr at 10/03/20 2228, 2 g at 10/03/20 2228 .  HYDROcodone-acetaminophen (NORCO/VICODIN) 5-325 MG per tablet 1-2 tablet, 1-2 tablet, Oral, Q4H PRN, Judd Gaudier V, MD .  metoprolol tartrate (LOPRESSOR) tablet 100 mg, 100 mg, Oral, BID, Enzo Bi, MD, 100 mg at 10/04/20 0924 .  morphine 2 MG/ML injection 2 mg, 2 mg, Intravenous, Q2H PRN, Judd Gaudier V, MD .  ondansetron (ZOFRAN) tablet 4 mg, 4 mg, Oral, Q6H PRN **OR** ondansetron (ZOFRAN) injection 4 mg, 4 mg, Intravenous, Q6H PRN, Athena Masse, MD .  pantoprazole (PROTONIX) EC tablet 40 mg, 40 mg, Oral, BID, Enzo Bi, MD, 40 mg at 10/04/20 0924 .  sodium chloride 0.9 % bolus 500 mL, 500 mL, Intravenous,  Once, Carrie Mew, MD .  Warfarin - Pharmacist Dosing Inpatient, , Does not apply, q1600, Lorna Dib[facility], Given at 10/03/20 2229   Physical exam:  Vitals:   10/04/20 1100 10/04/20 1159 10/04/20 1607 10/04/20 2056  BP: 126/72 123/71 134/84 (!) 111/53  Pulse: 93 68 96 85  Resp: 17 17 17    Temp:  97.8 F (36.6 C) 98.7 F (37.1 C) 97.8 F (36.6 C)  TempSrc:      SpO2: 99% 100% 100% 99%  Height:       Physical Exam Constitutional:      Comments: Appears fatigued  Eyes:     Extraocular Movements: EOM normal.  Cardiovascular:     Rate and Rhythm: Normal rate. Rhythm irregular.     Heart sounds: Normal heart sounds.  Pulmonary:     Effort: Pulmonary effort is normal.     Breath sounds: Normal breath sounds.  Abdominal:     General: Bowel sounds are normal.     Palpations: Abdomen is soft.     Comments: Extensive bruising noted over  anterior abdominal wall  Skin:    General: Skin is warm and dry.  Neurological:     Mental Status: She is alert and oriented to person, place, and time.        CMP Latest Ref Rng & Units 10/04/2020  Glucose 70 - 99 mg/dL -  BUN 8 - 23 mg/dL -  Creatinine 0.44 - 1.00 mg/dL -  Sodium 135 - 145 mmol/L 128(L)  Potassium 3.5 - 5.1 mmol/L -  Chloride 98 - 111 mmol/L -  CO2 22 - 32 mmol/L -  Calcium 8.9 - 10.3 mg/dL -  Total Protein 6.5 - 8.1 g/dL -  Total Bilirubin 0.3 - 1.2 mg/dL -  Alkaline Phos 38 - 126 U/L -  AST 15 - 41 U/L -  ALT 0 - 44 U/L -   CBC Latest Ref Rng & Units 10/04/2020  WBC 4.0 - 10.5 K/uL 24.1(H)  Hemoglobin 12.0 - 15.0 g/dL 11.1(L)  Hematocrit 36.0 - 46.0 % 30.7(L)  Platelets 150 - 400 K/uL 180    @IMAGES @  DG Chest Portable 1 View  Result Date: 10/02/2020 CLINICAL DATA:  Back pain, weakness EXAM: PORTABLE CHEST 1 VIEW COMPARISON:  None. FINDINGS: Diffuse bilateral nodular areas of consolidation. No significant pleural effusion. No pneumothorax. Cardiomediastinal contours are likely within normal limits for technique. Calcified plaque along the thoracic aorta. IMPRESSION: Diffuse bilateral nodular areas of consolidation. Chest CT recommended for further evaluation. Electronically Signed   By: Macy Mis M.D.   On: 10/02/2020 19:41   CT CHEST ABDOMEN PELVIS WO CONTRAST  Result Date: 10/02/2020 CLINICAL DATA:  Abdominal distension. Low back pain. Pain status post fall. EXAM: CT CHEST, ABDOMEN AND PELVIS WITHOUT CONTRAST TECHNIQUE: Multidetector CT imaging of the chest, abdomen and pelvis was performed following the standard protocol without IV contrast. COMPARISON:  None. FINDINGS: CT CHEST FINDINGS Cardiovascular: The heart is enlarged. There are coronary artery calcifications. There are atherosclerotic changes of the thoracic aorta. There is no evidence for an aneurysm or large pleural effusion. Mediastinum/Nodes: --there are pathologically enlarged mediastinal  lymph nodes. For example there is a 3.1 cm partially calcified lymph node in the AP window (axial series 2, image 27). -- No hilar lymphadenopathy. -- No axillary lymphadenopathy. -- No supraclavicular lymphadenopathy. -- Normal thyroid gland where visualized. -  Unremarkable esophagus. Lungs/Pleura: There are innumerable pulmonary nodules throughout the bilateral lung fields. The largest is located in  the right lower lobe and measures approximately 3.1 cm. There is no pneumothorax. There is no large pleural effusion. Musculoskeletal: There is no acute displaced fracture. There is a sclerotic lesion involving the posterior T10 vertebral body. CT ABDOMEN PELVIS FINDINGS Hepatobiliary: The liver is somewhat cirrhotic in appearance. There is layering hyperdense debris within the gallbladder which may represent sludge or small stones.There is no biliary ductal dilation. Pancreas: There is a questionable masslike area at the level of the pancreatic body. The distal pancreatic body and tail are somewhat atrophic. This is not well evaluated in the absence of IV contrast (axial series 2, image 58). Spleen: Unremarkable. Adrenals/Urinary Tract: --Adrenal glands: Unremarkable. --Right kidney/ureter: No hydronephrosis or radiopaque kidney stones. --Left kidney/ureter: No hydronephrosis or radiopaque kidney stones. --Urinary bladder: Unremarkable. Stomach/Bowel: --Stomach/Duodenum: No hiatal hernia or other gastric abnormality. Normal duodenal course and caliber. --Small bowel: Unremarkable. --Colon: There is scattered colonic diverticula without CT evidence for diverticulitis. There is questionable mild wall thickening of the ascending colon. --Appendix: Normal. Vascular/Lymphatic: Atherosclerotic calcification is present within the non-aneurysmal abdominal aorta, without hemodynamically significant stenosis. --there is mild retroperitoneal adenopathy. --No mesenteric lymphadenopathy. --No pelvic or inguinal lymphadenopathy.  Reproductive: Unremarkable Other: There is a small volume of abdominal ascites. Multiple peritoneal implants and omental caking is noted. Musculoskeletal. No acute displaced fractures. Multiple pockets of subcutaneous gas are noted in the anterior abdominal wall, likely related to subcutaneous injections. IMPRESSION: 1. Findings are consistent with a metastatic process as evidence by innumerable pulmonary nodules throughout both lung fields in addition to findings concerning for omental caking and peritoneal implants within the abdomen. The primary lesion is not clearly identified, however there is a questionable masslike area involving the pancreatic body that is not well characterized in the absence of IV contrast. 2. Small volume ascites in the abdomen and pelvis. 3. Additional chronic findings as detailed above. Electronically Signed   By: Constance Holster M.D.   On: 10/02/2020 22:14    Assessment and plan- Patient is a 86 y.o. female presenting with symptoms of generalized weakness abnormal weight loss with nausea and vomiting found to have extensive metastatic disease involving bilateral lungs, mediastinal adenopathy as well as peritoneal carcinomatosis  I have reviewed CT chest abdomen and pelvis images independently.  Have also shown the CT images to patient's daughter and the patient in her room.  CT shows innumerable bilateral pulmonary nodules as well as peritoneal carcinomatosis and omental caking.  There is questionable thickening at the level of pancreatic body which is not well visualized on the noncontrast CT scan.  Although the site of primary is not entirely clear based on CT scan pancreatic primary remains high on the differential.  I have discussed patient's case with interventional radiology Dr. Willeen Cass this morning to see if biopsy would be feasible and he did recommend that CT-guided omental biopsy is an option.  However patient is presently on Coumadin and if an option of biopsy is  pursued as an inpatient she will have to come off Coumadin for 4 days and needs to be transitioned to heparin drip.  However if patient gets discharged prior to that this will have to be pursued as an outpatient with bridging Lovenox.  Discussed with patient and her family that based on CT scan findings she has stage IV disease regardless and treatment would be only palliative and not curative.  Systemic treatment will likely add few months but not years to her life expectancy.  Given the rapid decline in her performance  status over the last 3 months and findings noted on CT scan it would also be reasonable to forego further work-up and pursue best supportive care/hospice.  Patient's daughter would like to discuss this with the rest of the family as well and let us know if they would like to pursue biopsy or not.  I will also have NP Altha Harm from palliative care meet with the patient's family tomorrow.  I will add a CA 19-9 for her labs for tomorrow    Total face to face encounter time for this patient visit was 95mn.  >50% time spent in counseling and coordination of care.    Visit Diagnosis 1. Metastatic malignant neoplasm, unspecified site ([place]   2. Respiratory disease   3. Abdominal pain   4. SIADH (syndrome of inappropriate ADH production) ([place]                                                                                  5.  Goals of care counseling/discussion  Dr. ARanda Evens MD, MPH [id number]/02/2021  9:26 PM

## 2020-10-04 NOTE — Consult Note (Addendum)
PHARMACY CONSULT NOTE - FOLLOW UP  Pharmacy Consult for Electrolyte Monitoring and Replacement   Recent Labs: Potassium (mmol/L)  Date Value  10/04/2020 3.0 (L)   Magnesium (mg/dL)  Date Value  96/22/2979 1.4 (L)   Calcium (mg/dL)  Date Value  89/21/1941 6.8 (L)   Albumin (g/dL)  Date Value  74/04/1447 2.6 (L)   Sodium (mmol/L)  Date Value  10/04/2020 128 (L)     Assessment: 86 yo F with PMH significant for CAD, St.Jude Mechanical Mitral Heart Valve 2/2 rheumatic heart disease, Afib, HTN, dCHF, & HLD presenting with  ACS event, 1 month of generalized weakness with poor appetite, and 1 day emesis and Dh with electrolyte abnormalities.  Goal of Therapy:  Electrolytes wnl's  Plan:  Will give Mg 2g IV bolus x 1, and KCl IV x 4  Will recheck Mg, K, and Phos (refeed risk) with am labs  Albina Billet ,PharmD Clinical Pharmacist 10/04/2020 8:36 AM

## 2020-10-04 NOTE — TOC Initial Note (Signed)
Transition of Care [facility]) - Initial/Assessment Note    Patient Details  Name: Megan Becker MRN: [id number] Date of Birth: 09-15-1935  [facility] CM/SW Contact:    Liliana Cline, LCSW Phone Number: 10/04/2020, 2:53 PM  Clinical Narrative:              Readmision Risk Score increased today so CSW spoke with patient's daughter and spouse for Readmission Risk Screening. Patient lives with her husband who drives her to appointments. PCP is [place]. Pharmacy is [facility]. No DME, HH, or SNF history. CSW explained that PT eval order is in and CSW will follow up based on their recommendations if anything needs to be arranged prior to DC. Will continue to follow.   Expected Discharge Plan: Home/Self Care Barriers to Discharge: Continued Medical Work up   Patient Goals and CMS Choice Patient states their goals for this hospitalization and ongoing recovery are:: home with spouse CMS Medicare.gov Compare Post Acute Care list provided to:: Patient Represenative (must comment) Choice offered to / list presented to : Spouse,Adult Children  Expected Discharge Plan and Services Expected Discharge Plan: Home/Self Care       Living arrangements for the past 2 months: Single Family Home                                      Prior Living Arrangements/Services Living arrangements for the past 2 months: Single Family Home Lives with:: Spouse Patient language and need for interpreter reviewed:: Yes Do you feel safe going back to the place where you live?: Yes      Need for Family Participation in Patient Care: Yes (Comment) Care giver support system in place?: Yes (comment)   Criminal Activity/Legal Involvement Pertinent to Current Situation/Hospitalization: No - Comment as needed  Activities of Daily Living      Permission Sought/Granted Permission sought to share information with : Oceanographer granted to share information  with : Yes, Designer, fashion/clothing (by spouse)     Permission granted to share info w AGENCY: HH, DME, SNF as needed        Emotional Assessment         Alcohol / Substance Use: Not Applicable Psych Involvement: No (comment)  Admission diagnosis:  SIADH (syndrome of inappropriate ADH production) (HCC) [E22.2] Hyponatremia [E87.1] Cancer (HCC) [C80.1] Respiratory disease [J98.9] Abdominal pain [R10.9] Metastatic malignant neoplasm, unspecified site [facility]) [C79.9] Patient Active Problem List   Diagnosis Date Noted  . Cancer (HCC) 10/03/2020  . Lung metastasis (H[facility]) 10/02/2020  . Hyponatremia 10/02/2020  . Hypokalemia 10/02/2020  . Pancreatic mass on CT 10/02/2020  . Generalized weakness 10/02/2020  . Chronic anticoagulation 10/02/2020  . AKI (acute kidney injury) (HCC) 10/02/2020  . Leukocytosis 10/02/2020  . Sepsis (HCC) 10/02/2020  . Lactic acidosis 10/02/2020  . Chronic heart failure with preserved ejection fraction (HCC) 09/07/2020  . S/P MVR (mitral valve replacement) 09/07/2020  . Atrial fibrillation (HCC) 11/19/2011  . CAD (coronary artery disease) 11/19/2011  . Hypertension 11/19/2011   PCP:  Center, [facility] Pharmacy:   [facility] [address] [place] - [address] [place] [address] Phone: [phone] Fax: [phone]     Social Determinants of Health (SDOH) Interventions    Readmission Risk Interventions No flowsheet data found.

## 2020-10-04 NOTE — Consult Note (Signed)
ANTICOAGULATION CONSULT NOTE - Consult  Pharmacy Consult for Warfarin dosing Indication: Mechanical Heart Valve 2/2 rheumatic heart disease and AFib  No Known Allergies  Patient Measurements: Height: 5\' 3"  (160 cm) IBW/kg (Calculated) : 52.4  Vital Signs: Temp: 97.7 F (36.5 C) (01/06 0741) BP: 132/100 (01/06 0741) Pulse Rate: 138 (01/06 0741)  Labs: Recent Labs    10/02/20 1901 10/02/20 2159 10/03/20 0452 10/04/20 0314  HGB 14.3  --  12.1 11.1*  HCT 39.9  --  34.2* 30.7*  PLT 273  --  210 180  APTT 58*  --  48*  --   LABPROT 24.8*  --  28.5* 32.6*  INR 2.3*  --  2.8* 3.3*  CREATININE 1.65*  --  1.76* 1.20*  TROPONINIHS 18* 23*  --   --     CrCl cannot be calculated (Unknown ideal weight.).   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  PTA patient was taking 2mg  Sun/Wed/Fri & 3mg  Tue/Thu/Sat skipping Monday and targeting INR goal of 2.5-3.5. Warfarin originally held in anticipation of endoscopy scheduled for 1/5, however in current event will resume warfarin dosing per consult.  Date Time INR   Warfarin Comment 1/04 1901 2.3  1/05 0452 2.8 2mg    (received late) 1/06  0314 3.3   Goal of Therapy:  INR: 2.5-3.5 (mechanical valve) Monitor platelets by anticoagulation protocol: Yes   Plan:  Patient's INR is 3.3 on 1/06 @ 0314 in goal INR range.   Will remain with lower end of dosing  at 2mg  x1 tonight due to upper end of range.  Monitor with daily INR closely while on azithromycin/ceftriaxone.  3/05, PharmD, BCPS Clinical Pharmacist 10/04/2020 8:10 AM

## 2020-10-04 NOTE — Evaluation (Signed)
Physical Therapy Evaluation Patient Details Name: Megan Becker MRN: [id number] DOB: [birth year] Today's Date: 10/04/2020   History of Present Illness  Megan Becker is a 86 y.o. female with medical history significant for   CAD, mechanical heart valve secondary to rheumatic heart disease on Coumadin, HTN, A. fib, diastolic heart failure who presents to the emergency room by EMS initially as a code STEMI which was ruled out by cardiologist, Dr. Okey Dupre, who complains of a 1 month history of generalized weakness with poor appetite and poor oral intake as well as 1 day history of nonbloody nonbilious vomiting without coffee grounds and episodes of several episodes of diarrhea.  Found to have pancreatic mass with extensive lung mets.  Clinical Impression  Patient received in bed, daughter present. Patient is weak, sob with mobility. Mod independence with bed mobility, increased time and effort needed. Patient requires min/mod assist for sit to stand and stand pivot transfer to [facility]. She is sob with minimal activity. O2 sats at 86% on 1 lpm after sitting up on side of bed and donning socks. Patient will continue to benefit from skilled PT while here to improve functional independence, strength and safety with mobility. I am hopeful that she will progress enough with mobility to return home at discharge but will continue to assess.       Follow Up Recommendations Home health PT    Equipment Recommendations  Rolling walker with 5" wheels    Recommendations for Other Services       Precautions / Restrictions Precautions Precautions: Fall Restrictions Weight Bearing Restrictions: No      Mobility  Bed Mobility Overal bed mobility: Modified Independent             General bed mobility comments: increased time and effort, use of bed rails    Transfers Overall transfer level: Needs assistance Equipment used: 1 person hand held assist Transfers: Sit to/from Stand;Stand Pivot Transfers Sit to  Stand: Min assist Stand pivot transfers: Min assist          Ambulation/Gait Ambulation/Gait assistance: Min assist Gait Distance (Feet): 3 Feet Assistive device: 1 person hand held assist Gait Pattern/deviations: Step-to pattern;Decreased step length - right;Decreased step length - left;Decreased stride length Gait velocity: decreased   General Gait Details: patient requires min assist for ambulation of 3 feet along edge of bed. Weak and sob with mobility  Stairs            Wheelchair Mobility    Modified Rankin (Stroke Patients Only)       Balance Overall balance assessment: Needs assistance;History of Falls Sitting-balance support: Feet supported Sitting balance-Leahy Scale: Good     Standing balance support: Bilateral upper extremity supported;During functional activity Standing balance-Leahy Scale: Poor Standing balance comment: reliant on B UE support for safety due to weakness and decreased balance.                             Pertinent Vitals/Pain Pain Assessment: No/denies pain    Home Living Family/patient expects to be discharged to:: Private residence Living Arrangements: Spouse/significant other Available Help at Discharge: Family;Available 24 hours/day Type of Home: House Home Access: Stairs to enter Entrance Stairs-Rails: Doctor, general practice of Steps: 3 Home Layout: One level Home Equipment: None      Prior Function Level of Independence: Independent               Hand Dominance  Extremity/Trunk Assessment   Upper Extremity Assessment Upper Extremity Assessment: Generalized weakness    Lower Extremity Assessment Lower Extremity Assessment: Generalized weakness    Cervical / Trunk Assessment Cervical / Trunk Assessment: Kyphotic  Communication   Communication: No difficulties  Cognition Arousal/Alertness: Awake/alert Behavior During Therapy: WFL for tasks assessed/performed Overall  Cognitive Status: Within Functional Limits for tasks assessed                                        General Comments      Exercises     Assessment/Plan    PT Assessment Patient needs continued PT services  PT Problem List Decreased strength;Decreased mobility;Decreased activity tolerance;Decreased balance;Decreased knowledge of use of DME;Cardiopulmonary status limiting activity       PT Treatment Interventions Therapeutic activities;Therapeutic exercise;Gait training;Stair training;Balance training;Functional mobility training;Patient/family education    PT Goals (Current goals can be found in the Care Plan section)  Acute Rehab PT Goals Patient Stated Goal: patient wants to go home PT Goal Formulation: With patient/family Time For Goal Achievement: 10/18/20 Potential to Achieve Goals: Fair    Frequency Min 2X/week   Barriers to discharge        Co-evaluation               AM-PAC PT "6 Clicks" Mobility  Outcome Measure Help needed turning from your back to your side while in a flat bed without using bedrails?: A Little Help needed moving from lying on your back to sitting on the side of a flat bed without using bedrails?: A Little Help needed moving to and from a bed to a chair (including a wheelchair)?: A Lot Help needed standing up from a chair using your arms (e.g., wheelchair or bedside chair)?: A Lot Help needed to walk in hospital room?: A Lot Help needed climbing 3-5 steps with a railing? : A Lot 6 Click Score: 14    End of Session Equipment Utilized During Treatment: Gait belt;Oxygen Activity Tolerance: Patient limited by fatigue Patient left: in bed;with call bell/phone within reach;with bed alarm set;with family/visitor present Nurse Communication: Mobility status PT Visit Diagnosis: Unsteadiness on feet (R26.81);Muscle weakness (generalized) (M62.81);Other abnormalities of gait and mobility (R26.89);History of falling  (Z91.81);Difficulty in walking, not elsewhere classified (R26.2)    Time: [id number] PT Time Calculation (min) (ACUTE ONLY): 39 min   Charges:   PT Evaluation $PT Eval Moderate Complexity: 1 Mod PT Treatments $Therapeutic Activity: 8-22 mins        Brecken Walth, PT, GCS 10/04/20,2:54 PM

## 2020-10-05 DIAGNOSIS — C801 Malignant (primary) neoplasm, unspecified: Secondary | ICD-10-CM | POA: Diagnosis not present

## 2020-10-05 DIAGNOSIS — Z515 Encounter for palliative care: Secondary | ICD-10-CM

## 2020-10-05 LAB — PHOSPHORUS: Phosphorus: 1.4 mg/dL — ABNORMAL LOW (ref 2.5–4.6)

## 2020-10-05 LAB — BASIC METABOLIC PANEL
Anion gap: 8 (ref 5–15)
BUN: 15 mg/dL (ref 8–23)
CO2: 25 mmol/L (ref 22–32)
Calcium: 7.2 mg/dL — ABNORMAL LOW (ref 8.9–10.3)
Chloride: 99 mmol/L (ref 98–111)
Creatinine, Ser: 0.85 mg/dL (ref 0.44–1.00)
GFR, Estimated: >60 mL/min (ref >60)
Glucose, Bld: 115 mg/dL — ABNORMAL HIGH (ref 70–99)
Potassium: 3.4 mmol/L — ABNORMAL LOW (ref 3.5–5.1)
Sodium: 132 mmol/L — ABNORMAL LOW (ref 135–145)

## 2020-10-05 LAB — MAGNESIUM: Magnesium: 2.1 mg/dL (ref 1.7–2.4)

## 2020-10-05 LAB — CBC
HCT: 32 % — ABNORMAL LOW (ref 36.0–46.0)
Hemoglobin: 10.9 g/dL — ABNORMAL LOW (ref 12.0–15.0)
MCH: 33.4 pg (ref 26.0–34.0)
MCHC: 34.1 g/dL (ref 30.0–36.0)
MCV: 98.2 fL (ref 80.0–100.0)
Platelets: 198 10*3/uL (ref 150–400)
RBC: 3.26 MIL/uL — ABNORMAL LOW (ref 3.87–5.11)
RDW: 13.4 % (ref 11.5–15.5)
WBC: 20.6 10*3/uL — ABNORMAL HIGH (ref 4.0–10.5)
nRBC: 0 % (ref 0.0–0.2)

## 2020-10-05 LAB — CULTURE, BLOOD (SINGLE)

## 2020-10-05 LAB — PROTIME-INR
INR: 4.5 (ref 0.8–1.2)
Prothrombin Time: 41.4 seconds — ABNORMAL HIGH (ref 11.4–15.2)

## 2020-10-05 MED ORDER — LOPERAMIDE HCL 2 MG PO CAPS
2.0000 mg | ORAL_CAPSULE | Freq: Once | ORAL | Status: AC
  Administered 2020-10-05: 2 mg via ORAL
  Filled 2020-10-05: qty 1

## 2020-10-05 MED ORDER — AZITHROMYCIN 500 MG PO TABS
500.0000 mg | ORAL_TABLET | Freq: Every day | ORAL | Status: DC
  Administered 2020-10-05: 500 mg via ORAL
  Filled 2020-10-05: qty 1

## 2020-10-05 MED ORDER — POTASSIUM CHLORIDE CRYS ER 20 MEQ PO TBCR
40.0000 meq | EXTENDED_RELEASE_TABLET | Freq: Once | ORAL | Status: AC
  Administered 2020-10-05: 40 meq via ORAL
  Filled 2020-10-05: qty 2

## 2020-10-05 MED ORDER — POTASSIUM & SODIUM PHOSPHATES 280-160-250 MG PO PACK
1.0000 | PACK | Freq: Three times a day (TID) | ORAL | Status: DC
  Administered 2020-10-05 – 2020-10-06 (×4): 1 via ORAL
  Filled 2020-10-05 (×5): qty 1

## 2020-10-05 NOTE — Progress Notes (Signed)
I met with patient, husband, two daughters and son.  Patient participated in the conversation and was able to articulate to family and me her wishes for ongoing care.  Patient stated that she was not interested in pursuing biopsy or treatment for the cancer.  Ultimately, she wants to focus on comfort and quality of life at home.  Both patient and family are in agreement with pursuing hospice at home.  Family requested [facility] and I spoke with Kieth Brightly, RN who will also meet with family to coordinate hospice services.  We discussed CODE STATUS.  Patient stated clearly that she would not want to be resuscitated or have her life prolonged artificially machines.  Family were in agreement with this decision.  DNR order signed and placed on chart.  Family would be interested in completing ACP documents.  Will consult chaplain to assist.  Plan: -Home with hospice when medically ready -Hospice liaison to coordinate -DNR/DNI -Chaplain consult to help with ACP documents  Time Total: 30 minutes  Visit consisted of counseling and education dealing with the complex and emotionally intense issues of symptom management and palliative care in the setting of serious and potentially life-threatening illness.Greater than 50%  of this time was spent counseling and coordinating care related to the above assessment and plan.  Signed by: Altha Harm, PhD, NP-C

## 2020-10-05 NOTE — Progress Notes (Signed)
Follow-up from previous Chaplain's visits for AD information. Patient was awake with husband at bedside. Daughter was not able to make it to hospital today, the husband asked for the packet to be left with him, to be completed later.  The wife mentioned the fact that none could stay here at the hospital. My response was how tough that must be, I followed up with the question of how long they had been married; 65 years was the response. They both joked about the length of time along with other small conversations.

## 2020-10-05 NOTE — Progress Notes (Signed)
PROGRESS NOTE    Megan Becker  [id number] DOB: Dec 28, 1934 DOA: 10/02/2020 PCP: [facility] [place]:   Active Problems:   Atrial fibrillation (HCC)   CAD (coronary artery disease)   Chronic heart failure with preserved ejection fraction (HCC)   Hypertension   S/P MVR (mitral valve replacement)   Lung metastasis (HCC)   Hyponatremia   Hypokalemia   Pancreatic mass on CT   Generalized weakness   Chronic anticoagulation   AKI (acute kidney injury) ([place])   Leukocytosis   Sepsis (HCC)   Lactic acidosis   Cancer [facility])   Palliative care encounter   Megan Becker is a 86 y.o. female with medical history significant for  CAD, mechanical heart valve secondary to rheumatic heart disease on Coumadin, HTN, A. fib, diastolic heart failure who presents to the emergency room by EMS initially as a code STEMI which was ruled out by cardiologist, Dr. Saunders Revel, who complains of a 1 month history of generalized weakness with poor appetite and poor oral intake as well as 1 day history of nonbloody nonbilious vomiting without coffee grounds and episodes of several episodes of diarrhea.  She reports mid back pain of moderate to severe intensity that radiates to the left shoulder, that is dull.       Lung metastasis ([place])   Pancreatic mass on CT -imaging showing extensive lung metastases with possible pancreatic primary Plan: --oncology consult, Dr. Janese Banks --Pt declined biopsy. --oncology palliative care consult today, pt decided to go home with hospice --Hospice to set up DME at home    Hyponatremia, improved Na 124 on presentation, improved with NS infusion.   --cont NS@50     Hypokalemia --replete with IV potassium  Hypophos --replete PRN  SIRS -Patient with vomiting and diarrhea, tachycardic and tachypneic, WBC 15,000 with lactic acid 3.2, No clear source of infection currently.   --CT chest didn't show consolidation and pt is not hypoxic, though pt was started  on empiric PNA treatment with ceftriaxone and azithromycin -With GI symptoms, could be gastroenteritis  --WBC trending up this morning --procalcitonin 0.24 on presentation, increased to 3.49 today despite being on abx Plan: --cont empiric ceftriaxone and azithromycin    Lactic acidosis --3.2 on presentation, improved to 2.9 with IVF --cont NS@50     Atrial fibrillation (HCC) -Rate controlled.   --cont home metop --Hold home warfarin due to supratherapeutic INR    CAD (coronary artery disease) -Troponin slightly elevated at 18->23, not significant. -Patient initially presented as a code STEMI but ruled out by cardiology    Chronic heart failure with preserved ejection fraction (HCC) -BNP elevated above 200 -Patient mostly hypovolemic but monitor for fluid overload in view of IV hydration  Hx of  Hypertension -BP intermittently low --hold amlodipine --cont metop    S/P MVR (mitral valve replacement) on warfarin -Pharmacy consulted for warfarin management --Hold home warfarin due to supratherapeutic INR    AKI (acute kidney injury) ([place]) -Creatinine 1.65 above baseline of 0.8 a year ago -Prerenal related to fluid losses from diarrhea Plan: --cont NS@50    DVT prophylaxis: [id number] Code Status: DNR  Family Communication: husband updated at bedside today Status is: inpatient Dispo:   The patient is from: home Anticipated d/c is to: home Anticipated d/c date is: tomorrow Patient currently is medically stable to d/c.  Just waiting on DME to be delivered.    Subjective and Interval History:  Pt complained of diarrhea.  No pain.   Pt  has decided to forgo biopsy, and would like to go home with hospice.   Objective: Vitals:   10/05/20 0521 10/05/20 0723 10/05/20 1135 10/05/20 1542  BP: (!) 129/96 130/75 131/77 138/76  Pulse: (!) 103 (!) 105 (!) 102 98  Resp: 16 17 18 18   Temp: 98.3 F (36.8 C) 98.4 F (36.9 C) 98.2 F (36.8 C) 98.6 F (37 C)  TempSrc:       SpO2: 100% 100% 100% 100%  Height:        Intake/Output Summary (Last 24 hours) at 10/05/2020 2109 Last data filed at 10/05/2020 1224 Gross per 24 hour  Intake 750 ml  Output 200 ml  Net 550 ml   There were no vitals filed for this visit.  Examination:   Constitutional: NAD, AAOx3 HEENT: conjunctivae and lids normal, EOMI CV: No cyanosis.   RESP: normal respiratory effort, on RA Extremities: No effusions, edema in BLE SKIN: warm, dry Neuro: II - XII grossly intact.   Psych: depressed mood and affect.  Appropriate judgement and reason   Data Reviewed: I have personally reviewed following labs and imaging studies  CBC: Recent Labs  Lab 10/02/20 1901 10/03/20 0452 10/04/20 0314 10/05/20 0627  WBC 15.3* 23.9* 24.1* 20.6*  NEUTROABS 12.3* 21.3*  --   --   HGB 14.3 12.1 11.1* 10.9*  HCT 39.9 34.2* 30.7* 32.0*  MCV 93.7 95.5 94.5 98.2  PLT 273 210 180 833   Basic Metabolic Panel: Recent Labs  Lab 10/02/20 1901 10/03/20 0452 10/03/20 1302 10/03/20 2352 10/04/20 0314 10/04/20 0651 10/04/20 1217 10/05/20 0627  NA 124* 126*   < > 126* 129* 128* 128* 132*  K 2.9* 2.7*  --   --  3.0*  --   --  3.4*  CL 81* 89*  --   --  94*  --   --  99  CO2 27 27  --   --  25  --   --  25  GLUCOSE 179* 123*  --   --  122*  --   --  115*  BUN 16 18  --   --  18  --   --  15  CREATININE 1.65* 1.76*  --   --  1.20*  --   --  0.85  CALCIUM 8.3* 7.3*  --   --  6.8*  --   --  7.2*  MG  --   --   --   --  1.4*  --   --  2.1  PHOS  --   --   --   --   --   --   --  1.4*   < > = values in this interval not displayed.   GFR: CrCl cannot be calculated (Unknown ideal weight.). Liver Function Tests: Recent Labs  Lab 10/02/20 1901 10/03/20 0452  AST 35 42*  ALT 20 20  ALKPHOS 123 119  BILITOT 2.3* 1.6*  PROT 6.5 5.2*  ALBUMIN 3.5 2.6*   Recent Labs  Lab 10/02/20 1901  LIPASE 48   No results for input(s): AMMONIA in the last 168 hours. Coagulation Profile: Recent Labs  Lab  10/02/20 1901 10/03/20 0452 10/04/20 0314 10/05/20 0627  INR 2.3* 2.8* 3.3* 4.5*   Cardiac Enzymes: No results for input(s): CKTOTAL, CKMB, CKMBINDEX, TROPONINI in the last 168 hours. BNP (last 3 results) No results for input(s): PROBNP in the last 8760 hours. HbA1C: No results for input(s): HGBA1C in the last 72 hours. CBG:  No results for input(s): GLUCAP in the last 168 hours. Lipid Profile: No results for input(s): CHOL, HDL, LDLCALC, TRIG, CHOLHDL, LDLDIRECT in the last 72 hours. Thyroid Function Tests: No results for input(s): TSH, T4TOTAL, FREET4, T3FREE, THYROIDAB in the last 72 hours. Anemia Panel: No results for input(s): VITAMINB12, FOLATE, FERRITIN, TIBC, IRON, RETICCTPCT in the last 72 hours. Sepsis Labs: Recent Labs  Lab 10/02/20 2017 10/02/20 2159 10/03/20 0452 10/04/20 1217  PROCALCITON 0.24  --   --  3.49  LATICACIDVEN  --  3.2* 2.9*  --     Recent Results (from the past 240 hour(s))  SARS CORONAVIRUS 2 (TAT 6-24 HRS) Nasopharyngeal Nasopharyngeal Swab     Status: None   Collection Time: 10/01/20 11:10 AM   Specimen: Nasopharyngeal Swab  Result Value Ref Range Status   SARS Coronavirus 2 NEGATIVE NEGATIVE Final    Comment: (NOTE) SARS-CoV-2 target nucleic acids are NOT DETECTED.  The SARS-CoV-2 RNA is generally detectable in upper and lower respiratory specimens during the acute phase of infection. Negative results do not preclude SARS-CoV-2 infection, do not rule out co-infections with other pathogens, and should not be used as the sole basis for treatment or other patient management decisions. Negative results must be combined with clinical observations, patient history, and epidemiological information. The expected result is Negative.  Fact Sheet for Patients: HairSlick.no  Fact Sheet for Healthcare Providers: quierodirigir.com  This test is not yet approved or cleared by the [place]  FDA and  has been authorized for detection and/or diagnosis of SARS-CoV-2 by FDA under an Emergency Use Authorization (EUA). This EUA will remain  in effect (meaning this test can be used) for the duration of the COVID-19 declaration under Se ction 564(b)(1) of the Act, 21 U.S.C. section 360bbb-3(b)(1), unless the authorization is terminated or revoked sooner.  Performed at [facility], [address], [place], [place] [address]   Blood culture (single)     Status: Abnormal   Collection Time: 10/02/20 10:11 PM   Specimen: BLOOD  Result Value Ref Range Status   Specimen Description   Final    BLOOD LEFT ANTECUBITAL Performed at [facility], [address] [place], [place] [address]    Special Requests   Final    BOTTLES DRAWN AEROBIC AND ANAEROBIC Blood Culture results may not be optimal due to an excessive volume of blood received in culture bottles Performed at [facility], [address] [place], [place] [address]    Culture  Setup Time   Final    Organism ID to follow GRAM POSITIVE COCCI AEROBIC BOTTLE ONLY CRITICAL RESULT CALLED TO, READ BACK BY AND VERIFIED WITH: Lovie Chol 2001 10/03/2020 DB Performed at [facility], [address] [place], [place] [address]    Culture (A)  Final    STAPHYLOCOCCUS EPIDERMIDIS THE SIGNIFICANCE OF ISOLATING THIS ORGANISM FROM A SINGLE SET OF BLOOD CULTURES WHEN MULTIPLE SETS ARE DRAWN IS UNCERTAIN. PLEASE NOTIFY THE MICROBIOLOGY DEPARTMENT WITHIN ONE WEEK IF SPECIATION AND SENSITIVITIES ARE REQUIRED. Performed at [facility], [address] [place], [place] [address]    Report Status 10/05/2020 FINAL  Final  Blood Culture ID Panel (Reflexed)     Status: Abnormal   Collection Time: 10/02/20 10:11 PM  Result Value Ref Range Status   Enterococcus faecalis NOT DETECTED NOT DETECTED Final   Enterococcus Faecium NOT DETECTED NOT DETECTED Final   Listeria monocytogenes NOT DETECTED NOT  DETECTED Final  Staphylococcus species DETECTED (A) NOT DETECTED Final    Comment: CRITICAL RESULT CALLED TO, READ BACK BY AND VERIFIED WITH: CLARISSA DOLAN, PHAR 2001 10/03/2020 DB    Staphylococcus aureus (BCID) NOT DETECTED NOT DETECTED Final   Staphylococcus epidermidis DETECTED (A) NOT DETECTED Final    Comment: CRITICAL RESULT CALLED TO, READ BACK BY AND VERIFIED WITH: Raliegh Scarlet, PHAR 2001 10/03/2020 DB    Staphylococcus lugdunensis NOT DETECTED NOT DETECTED Final   Streptococcus species NOT DETECTED NOT DETECTED Final   Streptococcus agalactiae NOT DETECTED NOT DETECTED Final   Streptococcus pneumoniae NOT DETECTED NOT DETECTED Final   Streptococcus pyogenes NOT DETECTED NOT DETECTED Final   A.calcoaceticus-baumannii NOT DETECTED NOT DETECTED Final   Bacteroides fragilis NOT DETECTED NOT DETECTED Final   Enterobacterales NOT DETECTED NOT DETECTED Final   Enterobacter cloacae complex NOT DETECTED NOT DETECTED Final   Escherichia coli NOT DETECTED NOT DETECTED Final   Klebsiella aerogenes NOT DETECTED NOT DETECTED Final   Klebsiella oxytoca NOT DETECTED NOT DETECTED Final   Klebsiella pneumoniae NOT DETECTED NOT DETECTED Final   Proteus species NOT DETECTED NOT DETECTED Final   Salmonella species NOT DETECTED NOT DETECTED Final   Serratia marcescens NOT DETECTED NOT DETECTED Final   Haemophilus influenzae NOT DETECTED NOT DETECTED Final   Neisseria meningitidis NOT DETECTED NOT DETECTED Final   Pseudomonas aeruginosa NOT DETECTED NOT DETECTED Final   Stenotrophomonas maltophilia NOT DETECTED NOT DETECTED Final   Candida albicans NOT DETECTED NOT DETECTED Final   Candida auris NOT DETECTED NOT DETECTED Final   Candida glabrata NOT DETECTED NOT DETECTED Final   Candida krusei NOT DETECTED NOT DETECTED Final   Candida parapsilosis NOT DETECTED NOT DETECTED Final   Candida tropicalis NOT DETECTED NOT DETECTED Final   Cryptococcus neoformans/gattii NOT DETECTED NOT DETECTED  Final   Methicillin resistance mecA/C NOT DETECTED NOT DETECTED Final    Comment: Performed at [facility], [address] [place], [place] [address]  Gastrointestinal Panel by PCR , Stool     Status: None   Collection Time: 10/03/20 10:00 AM   Specimen: Stool  Result Value Ref Range Status   Campylobacter species NOT DETECTED NOT DETECTED Final   Plesimonas shigelloides NOT DETECTED NOT DETECTED Final   Salmonella species NOT DETECTED NOT DETECTED Final   Yersinia enterocolitica NOT DETECTED NOT DETECTED Final   Vibrio species NOT DETECTED NOT DETECTED Final   Vibrio cholerae NOT DETECTED NOT DETECTED Final   Enteroaggregative E coli (EAEC) NOT DETECTED NOT DETECTED Final   Enteropathogenic E coli (EPEC) NOT DETECTED NOT DETECTED Final   Enterotoxigenic E coli (ETEC) NOT DETECTED NOT DETECTED Final   Shiga like toxin producing E coli (STEC) NOT DETECTED NOT DETECTED Final   Shigella/Enteroinvasive E coli (EIEC) NOT DETECTED NOT DETECTED Final   Cryptosporidium NOT DETECTED NOT DETECTED Final   Cyclospora cayetanensis NOT DETECTED NOT DETECTED Final   Entamoeba histolytica NOT DETECTED NOT DETECTED Final   Giardia lamblia NOT DETECTED NOT DETECTED Final   Adenovirus F40/41 NOT DETECTED NOT DETECTED Final   Astrovirus NOT DETECTED NOT DETECTED Final   Norovirus GI/GII NOT DETECTED NOT DETECTED Final   Rotavirus A NOT DETECTED NOT DETECTED Final   Sapovirus (I, II, IV, and V) NOT DETECTED NOT DETECTED Final    Comment: Performed at [facility], [address] [place], [place] [address]  C Difficile Quick Screen w PCR reflex     Status: None   Collection Time: 10/03/20 10:00 AM  Specimen: Stool  Result Value Ref Range Status   C Diff antigen NEGATIVE NEGATIVE Final   C Diff toxin NEGATIVE NEGATIVE Final   C Diff interpretation No C. difficile detected.  Final    Comment: Performed at [facility], [address] [place], [place] [address]  Culture,  blood (Routine X 2) w Reflex to ID Panel     Status: None (Preliminary result)   Collection Time: 10/03/20 12:54 PM   Specimen: BLOOD  Result Value Ref Range Status   Specimen Description BLOOD RIGHT ANTECUBITAL  Final   Special Requests   Final    BOTTLES DRAWN AEROBIC AND ANAEROBIC Blood Culture adequate volume   Culture  Setup Time PENDING  Incomplete   Culture   Final    NO GROWTH 2 DAYS Performed at [facility], [address], [place],  [address]    Report Status PENDING  Incomplete      Radiology Studies: No results found.   Scheduled Meds: . aspirin EC  81 mg Oral Daily  . atorvastatin  10 mg Oral QPM  . azithromycin  500 mg Oral Daily  . loperamide  2 mg Oral Once  . metoprolol tartrate  100 mg Oral BID  . pantoprazole  40 mg Oral BID  . potassium & sodium phosphates  1 packet Oral TID WC & HS  . Warfarin - Pharmacist Dosing Inpatient   Does not apply q1600   Continuous Infusions: . sodium chloride 50 mL/hr at 10/04/20 2148  . cefTRIAXone (ROCEPHIN)  IV 2 g (10/04/20 2149)  . sodium chloride       LOS: 3 days     Enzo Bi, MD [facility] If 7PM-7AM, please contact night-coverage 10/05/2020, 9:09 PM

## 2020-10-05 NOTE — Progress Notes (Signed)
PHARMACIST - PHYSICIAN COMMUNICATION DR:   Billie Ruddy CONCERNING: Antibiotic IV to Oral Route Change Policy  RECOMMENDATION: This patient is receiving azithromycin by the intravenous route.  Based on criteria approved by the Pharmacy and Therapeutics Committee, the antibiotic(s) is/are being converted to the equivalent oral dose form(s).   DESCRIPTION: These criteria include:  Patient being treated for a respiratory tract infection, urinary tract infection, cellulitis or clostridium difficile associated diarrhea if on metronidazole  The patient is not neutropenic and does not exhibit a GI malabsorption state  The patient is eating (either orally or via tube) and/or has been taking other orally administered medications for a least 24 hours  The patient is improving clinically and has a Tmax < 100.5  If you have questions about this conversion, please contact the [place], PharmD, BCPS Clinical Pharmacist 10/05/2020 10:23 AM

## 2020-10-05 NOTE — Consult Note (Signed)
ANTICOAGULATION CONSULT NOTE - Consult  Pharmacy Consult for Warfarin dosing Indication: Mechanical Heart Valve 2/2 rheumatic heart disease and AFib  No Known Allergies  Patient Measurements: Height: 5\' 3"  (160 cm) IBW/kg (Calculated) : 52.4  Vital Signs: Temp: 98.4 F (36.9 C) (01/07 0723) BP: 130/75 (01/07 0723) Pulse Rate: 105 (01/07 0723)  Labs: Recent Labs    10/02/20 1901 10/02/20 2159 10/03/20 0452 10/04/20 0314 10/05/20 0627  HGB 14.3  --  12.1 11.1* 10.9*  HCT 39.9  --  34.2* 30.7* 32.0*  PLT 273  --  210 180 198  APTT 58*  --  48*  --   --   LABPROT 24.8*  --  28.5* 32.6* 41.4*  INR 2.3*  --  2.8* 3.3* 4.5*  CREATININE 1.65*  --  1.76* 1.20* 0.85  TROPONINIHS 18* 23*  --   --   --     CrCl cannot be calculated (Unknown ideal weight.).   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  PTA patient was taking 2mg  Sun/Wed/Fri & 3mg  Tue/Thu/Sat skipping Monday and targeting INR goal of 2.5-3.5. Warfarin originally held in anticipation of endoscopy scheduled for 1/5, however in current event will resume warfarin dosing per consult.  Date Time INR   Warfarin Comment 1/04 1901 2.3  1/05 0452 2.8 2mg    (received late) 1/06  0314 3.3 2mg  1/07  0627 4.5       HOLD  Goal of Therapy:  INR: 2.5-3.5 (mechanical valve) Monitor platelets by anticoagulation protocol: Yes   Plan:  Patient's INR is 4.5 - supratherapeutic   Will hold warfarin tonight   Monitor with daily INR/H&H closely while on azithromycin/ceftriaxone.  Lu Duffel, PharmD, BCPS Clinical Pharmacist 10/05/2020 10:10 AM

## 2020-10-05 NOTE — TOC Transition Note (Signed)
Transition of Care [facility]) - CM/SW Discharge Note   Patient Details  Name: Megan Becker MRN: [id number] Date of Birth: 1934/12/13  [facility] CM/SW Contact:  Magnus Ivan, LCSW Phone Number: 10/05/2020, 1:08 PM   Clinical Narrative:   CSW updated by MD that patient will go home with [facility]. Spoke with [employer] who reported they are waiting for DME to be delivered to the home and family wants to transport patient home, does not want EMS. No TOC needs identified prior to DC.    Final next level of care: Home w Hospice Care Barriers to Discharge: Barriers Resolved   Patient Goals and CMS Choice Patient states their goals for this hospitalization and ongoing recovery are:: home with hospice CMS Medicare.gov Compare Post Acute Care list provided to:: Patient Represenative (must comment) Choice offered to / list presented to : Adult Children,Spouse (by palliative NP and MD)  Discharge Placement                Patient to be transferred to facility by: family per their wishes Name of family member notified: family aware Patient and family notified of of transfer: 10/05/20  Discharge Plan and Services                                     Social Determinants of Health (SDOH) Interventions     Readmission Risk Interventions No flowsheet data found.

## 2020-10-05 NOTE — Consult Note (Signed)
[facility]  Telephone:([phone] [phone]   Name: Megan Becker Date: 10/05/2020 MRN: [id number]  DOB: 1935-01-08  Patient Care Team: [facility] [place] as PCP - General (General Practice)    REASON FOR CONSULTATION: Megan Becker is a 86 y.o. female with multiple medical problems including rheumatic heart disease on Coumadin, A. fib, diastolic dysfunction with history of CHF, who was admitted on 10/02/2020 initially as a code STEMI ultimately ruled out by cardiology.  However, patient underwent CT chest/abdomen/pelvis and was found to have innumerable pulmonary nodules bilaterally, questionable pancreatic mass, ascites, and peritoneal implants with omental caking concerning for an advanced malignancy.  Palliative care was consulted help address goals.  SOCIAL HISTORY:     reports that she has never smoked. She has never used smokeless tobacco. She reports that she does not drink alcohol and does not use drugs.  Patient is married and lives at home with her husband.  She has a son and daughter who live next-door and another daughter in [place].  ADVANCE DIRECTIVES:  Not on file  CODE STATUS: Full code  PAST MEDICAL HISTORY: Past Medical History:  Diagnosis Date  . Arrhythmia   . Asthma   . Cancer ([place])    skin  . CHF (congestive heart failure) ([place])   . Coronary artery disease   . Dysrhythmia    atrial fibrillation  . GERD (gastroesophageal reflux disease)   . HOH (hard of hearing)   . Hx of basal cell carcinoma 07/2016   R zygoma at hair line, R post. auricular neck  . Hypercholesteremia   . Hypertension   . Lower extremity edema   . Mitral valve replaced   . Osteopenia   . Osteoporosis   . Palpitations   . Rheumatic heart disease   . Vaginal bleeding     PAST SURGICAL HISTORY:  Past Surgical History:  Procedure Laterality Date  . CATARACT EXTRACTION W/PHACO Left 04/16/2015   Procedure: CATARACT  EXTRACTION PHACO AND INTRAOCULAR LENS PLACEMENT (IOC);  Surgeon: Estill Cotta, MD;  Location: [facility] ORS;  Service: Ophthalmology;  Laterality: Left;  [place]: 00:58 AP:22.7 CDE:23.44 PACK LOT: [id number] H  . CERVICAL CONE BIOPSY    . COLONOSCOPY  2002  . mitral valve replaced      HEMATOLOGY/ONCOLOGY HISTORY:  Oncology History   No history exists.    ALLERGIES:  has No Known Allergies.  MEDICATIONS:  Current Facility-Administered Medications  Medication Dose Route Frequency Provider Last Rate Last Admin  . 0.9 %  sodium chloride infusion   Intravenous Continuous Enzo Bi, MD 50 mL/hr at 10/04/20 2148 New Bag at 10/04/20 2148  . acetaminophen (TYLENOL) tablet 650 mg  650 mg Oral Q6H PRN Athena Masse, MD       Or  . acetaminophen (TYLENOL) suppository 650 mg  650 mg Rectal Q6H PRN Athena Masse, MD      . aspirin EC tablet 81 mg  81 mg Oral Daily Enzo Bi, MD   81 mg at 10/05/20 0857  . atorvastatin (LIPITOR) tablet 10 mg  10 mg Oral QPM Enzo Bi, MD   10 mg at 10/04/20 1740  . azithromycin (ZITHROMAX) tablet 500 mg  500 mg Oral Daily Enzo Bi, MD      . cefTRIAXone (ROCEPHIN) 2 g in sodium chloride 0.9 % 100 mL IVPB  2 g Intravenous Q24H Enzo Bi, MD 200 mL/hr at 10/04/20 2149 2 g at 10/04/20 2149  .  HYDROcodone-acetaminophen (NORCO/VICODIN) 5-325 MG per tablet 1-2 tablet  1-2 tablet Oral Q4H PRN Athena Masse, MD      . metoprolol tartrate (LOPRESSOR) tablet 100 mg  100 mg Oral BID Enzo Bi, MD   100 mg at 10/05/20 0856  . morphine 2 MG/ML injection 2 mg  2 mg Intravenous Q2H PRN Athena Masse, MD      . ondansetron [facility]) tablet 4 mg  4 mg Oral Q6H PRN Athena Masse, MD       Or  . ondansetron [facility]) injection 4 mg  4 mg Intravenous Q6H PRN Athena Masse, MD      . pantoprazole (PROTONIX) EC tablet 40 mg  40 mg Oral BID Enzo Bi, MD   40 mg at 10/05/20 0857  . potassium & sodium phosphates (PHOS-NAK) 280-160-250 MG packet 1 packet  1 packet Oral TID WC & HS  Lu Duffel, RPH      . potassium chloride SA (KLOR-CON) CR tablet 40 mEq  40 mEq Oral Once Shanlever, Pierce Crane, RPH      . sodium chloride 0.9 % bolus 500 mL  500 mL Intravenous Once Carrie Mew, MD      . Warfarin - Pharmacist Dosing Inpatient   Does not apply q1600 Lorna Dibble, [facility]   Given at 10/03/20 2229    VITAL SIGNS: BP 130/75 (BP Location: Right Arm)   Pulse (!) 105   Temp 98.4 F (36.9 C)   Resp 17   Ht _0  (1.6 m)   SpO2 100%   BMI 26.93 kg/m  There were no vitals filed for this visit.  Estimated body mass index is 26.93 kg/m as calculated from the following:   Height as of this encounter: _1  (1.6 m).   Weight as of 09/10/20: 152 lb (68.9 kg).  LABS: CBC:    Component Value Date/Time   WBC 20.6 (H) 10/05/2020 0627   HGB 10.9 (L) 10/05/2020 0627   HCT 32.0 (L) 10/05/2020 0627   PLT 198 10/05/2020 0627   MCV 98.2 10/05/2020 0627   NEUTROABS 21.3 (H) 10/03/2020 0452   LYMPHSABS 0.6 (L) 10/03/2020 0452   MONOABS 1.6 (H) 10/03/2020 0452   EOSABS 0.1 10/03/2020 0452   BASOSABS 0.1 10/03/2020 0452   Comprehensive Metabolic Panel:    Component Value Date/Time   NA 132 (L) 10/05/2020 0627   K 3.4 (L) 10/05/2020 0627   CL 99 10/05/2020 0627   CO2 25 10/05/2020 0627   BUN 15 10/05/2020 0627   CREATININE 0.85 10/05/2020 0627   GLUCOSE 115 (H) 10/05/2020 0627   CALCIUM 7.2 (L) 10/05/2020 0627   AST 42 (H) 10/03/2020 0452   ALT 20 10/03/2020 0452   ALKPHOS 119 10/03/2020 0452   BILITOT 1.6 (H) 10/03/2020 0452   PROT 5.2 (L) 10/03/2020 0452   ALBUMIN 2.6 (L) 10/03/2020 0452    RADIOGRAPHIC STUDIES: DG Chest Portable 1 View  Result Date: 10/02/2020 CLINICAL DATA:  Back pain, weakness EXAM: PORTABLE CHEST 1 VIEW COMPARISON:  None. FINDINGS: Diffuse bilateral nodular areas of consolidation. No significant pleural effusion. No pneumothorax. Cardiomediastinal contours are likely within normal limits for technique. Calcified plaque along the  thoracic aorta. IMPRESSION: Diffuse bilateral nodular areas of consolidation. Chest CT recommended for further evaluation. Electronically Signed   By: Macy Mis M.D.   On: 10/02/2020 19:41   CT CHEST ABDOMEN PELVIS WO CONTRAST  Result Date: 10/02/2020 CLINICAL DATA:  Abdominal distension. Low back pain. Pain  status post fall. EXAM: CT CHEST, ABDOMEN AND PELVIS WITHOUT CONTRAST TECHNIQUE: Multidetector CT imaging of the chest, abdomen and pelvis was performed following the standard protocol without IV contrast. COMPARISON:  None. FINDINGS: CT CHEST FINDINGS Cardiovascular: The heart is enlarged. There are coronary artery calcifications. There are atherosclerotic changes of the thoracic aorta. There is no evidence for an aneurysm or large pleural effusion. Mediastinum/Nodes: --there are pathologically enlarged mediastinal lymph nodes. For example there is a 3.1 cm partially calcified lymph node in the AP window (axial series 2, image 27). -- No hilar lymphadenopathy. -- No axillary lymphadenopathy. -- No supraclavicular lymphadenopathy. -- Normal thyroid gland where visualized. -  Unremarkable esophagus. Lungs/Pleura: There are innumerable pulmonary nodules throughout the bilateral lung fields. The largest is located in the right lower lobe and measures approximately 3.1 cm. There is no pneumothorax. There is no large pleural effusion. Musculoskeletal: There is no acute displaced fracture. There is a sclerotic lesion involving the posterior T10 vertebral body. CT ABDOMEN PELVIS FINDINGS Hepatobiliary: The liver is somewhat cirrhotic in appearance. There is layering hyperdense debris within the gallbladder which may represent sludge or small stones.There is no biliary ductal dilation. Pancreas: There is a questionable masslike area at the level of the pancreatic body. The distal pancreatic body and tail are somewhat atrophic. This is not well evaluated in the absence of IV contrast (axial series 2, image 58).  Spleen: Unremarkable. Adrenals/Urinary Tract: --Adrenal glands: Unremarkable. --Right kidney/ureter: No hydronephrosis or radiopaque kidney stones. --Left kidney/ureter: No hydronephrosis or radiopaque kidney stones. --Urinary bladder: Unremarkable. Stomach/Bowel: --Stomach/Duodenum: No hiatal hernia or other gastric abnormality. Normal duodenal course and caliber. --Small bowel: Unremarkable. --Colon: There is scattered colonic diverticula without CT evidence for diverticulitis. There is questionable mild wall thickening of the ascending colon. --Appendix: Normal. Vascular/Lymphatic: Atherosclerotic calcification is present within the non-aneurysmal abdominal aorta, without hemodynamically significant stenosis. --there is mild retroperitoneal adenopathy. --No mesenteric lymphadenopathy. --No pelvic or inguinal lymphadenopathy. Reproductive: Unremarkable Other: There is a small volume of abdominal ascites. Multiple peritoneal implants and omental caking is noted. Musculoskeletal. No acute displaced fractures. Multiple pockets of subcutaneous gas are noted in the anterior abdominal wall, likely related to subcutaneous injections. IMPRESSION: 1. Findings are consistent with a metastatic process as evidence by innumerable pulmonary nodules throughout both lung fields in addition to findings concerning for omental caking and peritoneal implants within the abdomen. The primary lesion is not clearly identified, however there is a questionable masslike area involving the pancreatic body that is not well characterized in the absence of IV contrast. 2. Small volume ascites in the abdomen and pelvis. 3. Additional chronic findings as detailed above. Electronically Signed   By: Constance Holster M.D.   On: 10/02/2020 22:14    PERFORMANCE STATUS (ECOG) : 2 - Symptomatic, <50% confined to bed  Review of Systems Unless otherwise noted, a complete review of systems is negative.  Physical Exam General: NAD, thin,  frail-appearing Cardiovascular: Irregular Pulmonary: Unlabored Extremities: no edema, no joint deformities Skin: no rashes Neurological: Weakness but otherwise nonfocal  IMPRESSION: I met with patient and husband.  Introduced palliative care services and attempted to establish therapeutic rapport.  Patient says that she recognizes that she likely has an advanced malignancy.  She spoke yesterday with Dr. Janese Banks about option of proceeding with tissue diagnosis.  However, today patient tells me that she does not think that she would want to pursue biopsy if it "ultimately will not make a difference."  With patient and husband in the  room, I called and spoke with her daughter, Rise Paganini.  Together, we discussed the option of pursuing hospice at home.  Rise Paganini is quite familiar with hospice as her husband is a Teacher, English as a foreign language of a hospice in [place], [place].  She says that her husband has reached out to [facility] today regarding potential care.   Family would like to meet with me including all 3 children to further discuss goals prior to making any decisions.  PLAN: -Family meeting    Time Total: 75 minutes  Visit consisted of counseling and education dealing with the complex and emotionally intense issues of symptom management and palliative care in the setting of serious and potentially life-threatening illness.Greater than 50%  of this time was spent counseling and coordinating care related to the above assessment and plan.  Signed by: Altha Harm, PhD, NP-C

## 2020-10-05 NOTE — Consult Note (Signed)
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  Goal of Therapy:  Electrolytes wnl's  Plan:  K 3.4 - will give KCl 34meq x 1  Phos 1.4 - Will start KPhos packets tid and bedtime  Mg 2.1 - no replenishment warranted  Will recheck Mg, K, and Phos (refeed risk) with am labs  Lu Duffel ,PharmD Clinical Pharmacist 10/05/2020 10:05 AM

## 2020-10-05 NOTE — Progress Notes (Signed)
   10/05/20 0130  Clinical Encounter Type  Visited With Patient  Visit Type Initial;Spiritual support;Social support  Referral From Nurse  Consult/Referral To Chaplain  ?Bland Span and Wilfrid Lund Visted Pt for AD. Pt asked [place] to come back later. Ch will follow up later.

## 2020-10-05 NOTE — Progress Notes (Signed)
[facility]) Hospital Liaison RN note:  Received request from Altha Harm, NP for hospice services at home after discharge. Chart and patient information has been reviewed and hospice eligibility has been approved.  Spoke with patient and family in the room to initiate education related to hospice philosphy, services and to answer any questions. Patient and family verbalized understanding and all questions were answered. Plan is for discharge once medically stable and DME is in place by private vehicle.  DME needs were discussed. Patient requests a Hospital bed, OBT, wheelchair, 3 in 1 potty, walker and O2. Address and contact information has been verified and is correct in the chart. DME is set to be delivered today.  Please send signed and completed DNR home with patient and family. Please provide prescriptions at discharge as needd to ensure ongoing symptom management.  [facility] information and contact numbers given to family. Above information shared with hospital care team.  Please call with any hospice related questions or concerns.  Thank you for the opportunity to participate I this patient's care.  Zandra Abts, RN [facility] Liaison [phone]

## 2020-10-06 DIAGNOSIS — Z7901 Long term (current) use of anticoagulants: Secondary | ICD-10-CM

## 2020-10-06 LAB — BASIC METABOLIC PANEL
Anion gap: 7 (ref 5–15)
BUN: 15 mg/dL (ref 8–23)
CO2: 25 mmol/L (ref 22–32)
Calcium: 7.2 mg/dL — ABNORMAL LOW (ref 8.9–10.3)
Chloride: 101 mmol/L (ref 98–111)
Creatinine, Ser: 0.73 mg/dL (ref 0.44–1.00)
GFR, Estimated: >60 mL/min (ref >60)
Glucose, Bld: 121 mg/dL — ABNORMAL HIGH (ref 70–99)
Potassium: 4.3 mmol/L (ref 3.5–5.1)
Sodium: 133 mmol/L — ABNORMAL LOW (ref 135–145)

## 2020-10-06 LAB — PHOSPHORUS: Phosphorus: 1.2 mg/dL — ABNORMAL LOW (ref 2.5–4.6)

## 2020-10-06 LAB — CBC
HCT: 31.2 % — ABNORMAL LOW (ref 36.0–46.0)
Hemoglobin: 10.6 g/dL — ABNORMAL LOW (ref 12.0–15.0)
MCH: 33.7 pg (ref 26.0–34.0)
MCHC: 34 g/dL (ref 30.0–36.0)
MCV: 99 fL (ref 80.0–100.0)
Platelets: 205 10*3/uL (ref 150–400)
RBC: 3.15 MIL/uL — ABNORMAL LOW (ref 3.87–5.11)
RDW: 13.8 % (ref 11.5–15.5)
WBC: 17.6 10*3/uL — ABNORMAL HIGH (ref 4.0–10.5)
nRBC: 0 % (ref 0.0–0.2)

## 2020-10-06 LAB — PROTIME-INR
INR: 6 (ref 0.8–1.2)
Prothrombin Time: 51.9 seconds — ABNORMAL HIGH (ref 11.4–15.2)

## 2020-10-06 LAB — MAGNESIUM: Magnesium: 1.9 mg/dL (ref 1.7–2.4)

## 2020-10-06 LAB — CANCER ANTIGEN 19-9: CA 19-9: 25 U/mL (ref 0–35)

## 2020-10-06 MED ORDER — POTASSIUM & SODIUM PHOSPHATES 280-160-250 MG PO PACK
2.0000 | PACK | Freq: Three times a day (TID) | ORAL | Status: DC
  Administered 2020-10-06 (×2): 2 via ORAL
  Filled 2020-10-06 (×6): qty 2

## 2020-10-06 MED ORDER — POTASSIUM PHOSPHATES 15 MMOLE/5ML IV SOLN
30.0000 mmol | Freq: Once | INTRAVENOUS | Status: AC
  Administered 2020-10-06: 30 mmol via INTRAVENOUS
  Filled 2020-10-06: qty 10

## 2020-10-06 MED ORDER — POTASSIUM & SODIUM PHOSPHATES 280-160-250 MG PO PACK
2.0000 | PACK | Freq: Three times a day (TID) | ORAL | Status: DC
  Filled 2020-10-06 (×5): qty 2

## 2020-10-06 MED ORDER — POTASSIUM & SODIUM PHOSPHATES 280-160-250 MG PO PACK: 2.0000 | PACK | Freq: Three times a day (TID) | ORAL | 0 refills | Status: AC

## 2020-10-06 NOTE — Progress Notes (Signed)
EMS arrived to pick up pt and transport home; husband at bedside. Pt states daughter took D/C papers home already. Pt assisted to stretcher and taken via ambulance to residence at this time.

## 2020-10-06 NOTE — Progress Notes (Signed)
[facility] ([facility])  Noted that pt will dc home today per discussion with PMT.  Advised hospice staff that she will need INR checked on Monday.  Venia Carbon RN, BSN, [facility] Liaison

## 2020-10-06 NOTE — TOC Transition Note (Signed)
Transition of Care [facility]) - CM/SW Discharge Note   Patient Details  Name: FRANCINE HANNAN MRN: [id number] Date of Birth: [birth year]  [facility]) CM/SW Contact:  Elliot Gurney New[place], [place] Phone Number: [phone] 10/06/2020, 4:26 PM   Clinical Narrative:    Patient's family requesting EMS transport home for patient. [facility] contacted for transport home today.  Crissy Mccreadie, LCSW Transition of Care [phone]    Final next level of care: Home w Hospice Care Barriers to Discharge: Barriers Resolved   Patient Goals and CMS Choice Patient states their goals for this hospitalization and ongoing recovery are:: home with hospice CMS Medicare.gov Compare Post Acute Care list provided to:: Patient Represenative (must comment) Choice offered to / list presented to : Adult Children,Spouse (by palliative NP and MD)  Discharge Placement                Patient to be transferred to facility by: family per their wishes Name of family member notified: family aware Patient and family notified of of transfer: 10/05/20  Discharge Plan and Services                                     Social Determinants of Health (SDOH) Interventions     Readmission Risk Interventions No flowsheet data found.

## 2020-10-06 NOTE — Consult Note (Addendum)
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  Goal of Therapy:  Electrolytes wnl's  Plan:   Phos 1.2 - ordered IV phos.   Will recheck Mg, K, and Phos (refeed risk) with am labs  Oswald Hillock ,PharmD Clinical Pharmacist 10/06/2020 8:39 AM

## 2020-10-06 NOTE — Consult Note (Signed)
ANTICOAGULATION CONSULT NOTE - Consult  Pharmacy Consult for Warfarin dosing Indication: Mechanical Heart Valve 2/2 rheumatic heart disease and AFib  No Known Allergies  Patient Measurements: Height: 5\' 3"  (160 cm) IBW/kg (Calculated) : 52.4  Vital Signs: Temp: 98.3 F (36.8 C) (01/08 0412) Temp Source: Oral (01/08 0006) BP: 147/80 (01/08 0412) Pulse Rate: 98 (01/08 0412)  Labs: Recent Labs    10/04/20 0314 10/05/20 0627 10/06/20 0458  HGB 11.1* 10.9* 10.6*  HCT 30.7* 32.0* 31.2*  PLT 180 198 205  LABPROT 32.6* 41.4* 51.9*  INR 3.3* 4.5* 6.0*  CREATININE 1.20* 0.85 0.73    CrCl cannot be calculated (Unknown ideal weight.).   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  PTA patient was taking 2mg  Sun/Wed/Fri & 3mg  Tue/Thu/Sat skipping Monday and targeting INR goal of 2.5-3.5. Warfarin originally held in anticipation of endoscopy scheduled for 1/5, however in current event will resume warfarin dosing per consult.  Date Time INR   Warfarin Comment 1/04 1901 2.3  1/05 0452 2.8 2mg    (received late) 1/06  0314 3.3 2mg  1/07  0627 4.5       HOLD 1/8 0458 6.0 HOLD  Goal of Therapy:  INR: 2.5-3.5 (mechanical valve) Monitor platelets by anticoagulation protocol: Yes   Plan:  INR is supratherapeutic   Will hold warfarin tonight. Plan for discharge home with hospice. Can consider to give vitamin K IV to help INR trend down if deem appropriate.   Monitor with daily INR/H&H closely while on azithromycin/ceftriaxone.  Oswald Hillock, PharmD, BCPS Clinical Pharmacist 10/06/2020 8:46 AM

## 2020-10-06 NOTE — Progress Notes (Signed)
Pt being discharged home with hospice by EMS. I went over all discharge instructions with Pt and family at bedside. IV taken out of left AC at 1230 due to infiltration and did not restick. Pt has all personal belongings. Pt given Potassium & sodium phosphate powder packets for at home use. Directed and Ok per provider. Eyvonne Mechanic, 1A Director informed. Went over dosing and instructions given by [facility].

## 2020-10-06 NOTE — Discharge Summary (Signed)
Physician Discharge Summary   Megan Becker  female DOB: 12/20/1934  XLK:[id number]  PCP: [facility] date: 10/02/2020 Discharge date: 10/06/2020  Admitted From: home Disposition:  Home with home hospice.  DME set up.  CODE STATUS: DNR  Discharge Instructions    Discharge instructions   Complete by: As directed    Dr. Janese Banks has asked hospice to go to your house and check your INR on Monday 10/08/20.  Your INR went up to 6 this morning even with warfarin held last night.  Dr. Janese Banks said to hold your warfarin today also and resume your home warfarin dose on Sunday 10/07/20.   Dr. Enzo Bi [facility] Course:  For full details, please see H&P, progress notes, consult notes and ancillary notes.  Briefly,  Megan Volpi Coxis a 86 y.o.femalewith medical history significant for CAD, mechanical heart valve secondary to rheumatic heart disease on Coumadin, HTN, A. fib, diastolic heart failure who presented to the emergency room by EMS initially as a code STEMI which was ruled out by cardiologist, Dr. Saunders Revel.    Pt complains ofa 1 month history ofgeneralized weaknesswithpoor appetite and poor oral intake as well as1 day history ofnonbloody nonbilious vomiting without coffee grounds and episodes of several episodes of diarrhea. She reports mid back pain of moderate to severe intensity that radiates to the left shoulder, that is dull.    Lung metastasis ([place]) Pancreatic mass on CT CT c/a/p showing extensive lung metastases with possible pancreatic primary.  oncology consult, Dr. Janese Banks saw pt.  Pt declined biopsy and decided to go home with hospice.  DME were set up at home prior to discharge.  Hyponatremia, improved Na 124 on presentation, improved with NS infusion.  Na 133 on the day of discharge.  Hypokalemia Monitored and repleted with IV potassium.  Hypophos Phos was still low despite repletion with oral Phos-NaK packs.  IV phos repletion  was ordered on the day of discharge, however, pt's IV was blown and multiple attempts weren't successful in establishing IV access.  Pt was therefore ordered Phos-NaK 2 packets TID, and will continue for 5 more days after discharge.  10 packets of Phos-NaK were brought up by pharmacy with labels and send home with pt.  SIRS Patient with vomiting and diarrhea, tachycardic and tachypneic, WBC 15,000 with lactic acid 3.2, No clear source of infection currently.  CT chest didn't show consolidation and pt is not hypoxic, though pt was started on empiric PNA treatment with ceftriaxone and azithromycin.  With GI symptoms, could be gastroenteritis.  procalcitonin 0.24 on presentation, increased to 3.49, so empiric ceftriaxone and azithromycin were continued during hospitalization.  Lactic acidosis 3.2 on presentation, improved to 2.9 with IVF.  Supratherapeutic INR INR trended up to 6.0 on the day of discharge (from 4.5) despite warfarin having been held.  Discussed with Dr. Janese Banks, who recommended holding warfarin for 1/8, and resume home dose on 1/9.  Dr. Janese Banks would arrange for INR check at home on 1/110.  Atrial fibrillation ([place]) on warfarin Rate controlled. continued home metop.  Warfarin management per above.  CAD (coronary artery disease) Troponin slightly elevated at18->23, not significant. Patient initially presented as a code STEMI but ruled out by cardiology  Chronic heart failure with preserved ejection fraction (HCC) BNP elevated above 200. Patient mostly hypovolemic.    Hx ofHypertension BP intermittently low.  Continued home metop, and held amlodipine, benazepril and HCTZ.  S/P MVR (  mitral valve replacement) on warfarin Prior to discharge, pt was supratherapeutic.  Warfarin management per above.  AKI (acute kidney injury) ([place]) Creatinine 1.65 on presentation, above baseline of 0.8 a year ago.  Prerenal related to fluid losses from diarrhea.  Pt received MIVF.   Cr 0.73 prior to discharge.   Discharge Diagnoses:  Active Problems:   Atrial fibrillation (HCC)   CAD (coronary artery disease)   Chronic heart failure with preserved ejection fraction (HCC)   Hypertension   S/P MVR (mitral valve replacement)   Lung metastasis (HCC)   Hyponatremia   Hypokalemia   Pancreatic mass on CT   Generalized weakness   Chronic anticoagulation   AKI (acute kidney injury) ([place])   Leukocytosis   Sepsis (HCC)   Lactic acidosis   Cancer [facility])   Palliative care encounter    Discharge Instructions:  Allergies as of 10/06/2020   No Known Allergies     Medication List    STOP taking these medications   amLODipine 5 MG tablet Commonly known as: NORVASC   aspirin 81 MG tablet   benazepril 40 MG tablet Commonly known as: LOTENSIN   CALTRATE 600 PLUS-VIT D PO   famotidine 10 MG tablet Commonly known as: PEPCID   hydrochlorothiazide 25 MG tablet Commonly known as: HYDRODIURIL   ondansetron 4 MG disintegrating tablet Commonly known as: ZOFRAN-ODT   potassium chloride 10 MEQ tablet Commonly known as: KLOR-CON   TERBINAFINE HCL PO     TAKE these medications   amoxicillin 500 MG tablet Commonly known as: AMOXIL Take 2,000 mg by mouth as directed. (1 hour before dental procedures)   atorvastatin 10 MG tablet Commonly known as: LIPITOR Take 10 mg by mouth every evening.   furosemide 20 MG tablet Commonly known as: LASIX Take 20 mg by mouth daily as needed for fluid. (Tuesdays)   loratadine 10 MG tablet Commonly known as: CLARITIN Take 10 mg by mouth daily.   metoprolol tartrate 100 MG tablet Commonly known as: LOPRESSOR Take 50-100 mg by mouth See admin instructions. Take 1 tablet (100mg ) by mouth every morning and take  tablet (50mg ) by mouth every evening   pantoprazole 40 MG tablet Commonly known as: PROTONIX Take 40 mg by mouth 2 (two) times daily.   potassium & sodium phosphates 280-160-250 MG Pack Commonly known as:  PHOS-NAK Take 2 packets by mouth with breakfast, with lunch, and with evening meal for 5 days.   VISION-VITE PRESERVE PO Take 1 tablet by mouth 2 (two) times daily. What changed: Another medication with the same name was removed. Continue taking this medication, and follow the directions you see here.   warfarin 3 MG tablet Commonly known as: COUMADIN Take 3 mg by mouth See admin instructions. Take 1 tablet (3mg ) by mouth every Tuesday, Thursday and Saturday evening   warfarin 2 MG tablet Commonly known as: COUMADIN Take 2 mg by mouth See admin instructions. Take 1 tablet (2mg ) by mouth every Sunday, Wednesday and Friday evening   warfarin 1 MG tablet Commonly known as: COUMADIN Take 1 mg by mouth daily.        Follow-up Information    Sindy Guadeloupe, MD Follow up.   Specialty: Oncology Contact information: [place] [address] [phone]        Hospice Follow up.               No Known Allergies   The results of significant diagnostics from this hospitalization (including imaging,  microbiology, ancillary and laboratory) are listed below for reference.   Consultations:   Procedures/Studies: DG Chest Portable 1 View  Result Date: 10/02/2020 CLINICAL DATA:  Back pain, weakness EXAM: PORTABLE CHEST 1 VIEW COMPARISON:  None. FINDINGS: Diffuse bilateral nodular areas of consolidation. No significant pleural effusion. No pneumothorax. Cardiomediastinal contours are likely within normal limits for technique. Calcified plaque along the thoracic aorta. IMPRESSION: Diffuse bilateral nodular areas of consolidation. Chest CT recommended for further evaluation. Electronically Signed   By: Macy Mis M.D.   On: 10/02/2020 19:41   CT CHEST ABDOMEN PELVIS WO CONTRAST  Result Date: 10/02/2020 CLINICAL DATA:  Abdominal distension. Low back pain. Pain status post fall. EXAM: CT CHEST, ABDOMEN AND PELVIS WITHOUT CONTRAST TECHNIQUE: Multidetector CT imaging  of the chest, abdomen and pelvis was performed following the standard protocol without IV contrast. COMPARISON:  None. FINDINGS: CT CHEST FINDINGS Cardiovascular: The heart is enlarged. There are coronary artery calcifications. There are atherosclerotic changes of the thoracic aorta. There is no evidence for an aneurysm or large pleural effusion. Mediastinum/Nodes: --there are pathologically enlarged mediastinal lymph nodes. For example there is a 3.1 cm partially calcified lymph node in the AP window (axial series 2, image 27). -- No hilar lymphadenopathy. -- No axillary lymphadenopathy. -- No supraclavicular lymphadenopathy. -- Normal thyroid gland where visualized. -  Unremarkable esophagus. Lungs/Pleura: There are innumerable pulmonary nodules throughout the bilateral lung fields. The largest is located in the right lower lobe and measures approximately 3.1 cm. There is no pneumothorax. There is no large pleural effusion. Musculoskeletal: There is no acute displaced fracture. There is a sclerotic lesion involving the posterior T10 vertebral body. CT ABDOMEN PELVIS FINDINGS Hepatobiliary: The liver is somewhat cirrhotic in appearance. There is layering hyperdense debris within the gallbladder which may represent sludge or small stones.There is no biliary ductal dilation. Pancreas: There is a questionable masslike area at the level of the pancreatic body. The distal pancreatic body and tail are somewhat atrophic. This is not well evaluated in the absence of IV contrast (axial series 2, image 58). Spleen: Unremarkable. Adrenals/Urinary Tract: --Adrenal glands: Unremarkable. --Right kidney/ureter: No hydronephrosis or radiopaque kidney stones. --Left kidney/ureter: No hydronephrosis or radiopaque kidney stones. --Urinary bladder: Unremarkable. Stomach/Bowel: --Stomach/Duodenum: No hiatal hernia or other gastric abnormality. Normal duodenal course and caliber. --Small bowel: Unremarkable. --Colon: There is scattered  colonic diverticula without CT evidence for diverticulitis. There is questionable mild wall thickening of the ascending colon. --Appendix: Normal. Vascular/Lymphatic: Atherosclerotic calcification is present within the non-aneurysmal abdominal aorta, without hemodynamically significant stenosis. --there is mild retroperitoneal adenopathy. --No mesenteric lymphadenopathy. --No pelvic or inguinal lymphadenopathy. Reproductive: Unremarkable Other: There is a small volume of abdominal ascites. Multiple peritoneal implants and omental caking is noted. Musculoskeletal. No acute displaced fractures. Multiple pockets of subcutaneous gas are noted in the anterior abdominal wall, likely related to subcutaneous injections. IMPRESSION: 1. Findings are consistent with a metastatic process as evidence by innumerable pulmonary nodules throughout both lung fields in addition to findings concerning for omental caking and peritoneal implants within the abdomen. The primary lesion is not clearly identified, however there is a questionable masslike area involving the pancreatic body that is not well characterized in the absence of IV contrast. 2. Small volume ascites in the abdomen and pelvis. 3. Additional chronic findings as detailed above. Electronically Signed   By: Constance Holster M.D.   On: 10/02/2020 22:14      Labs: BNP (last 3 results) Recent Labs    10/02/20 1954  BNP 0000000*   Basic Metabolic Panel: Recent Labs  Lab 10/02/20 1901 10/03/20 0452 10/03/20 1302 10/04/20 0314 10/04/20 0651 10/04/20 1217 10/05/20 0627 10/06/20 0458  NA 124* 126*   < > 129* 128* 128* 132* 133*  K 2.9* 2.7*  --  3.0*  --   --  3.4* 4.3  CL 81* 89*  --  94*  --   --  99 101  CO2 27 27  --  25  --   --  25 25  GLUCOSE 179* 123*  --  122*  --   --  115* 121*  BUN 16 18  --  18  --   --  15 15  CREATININE 1.65* 1.76*  --  1.20*  --   --  0.85 0.73  CALCIUM 8.3* 7.3*  --  6.8*  --   --  7.2* 7.2*  MG  --   --   --  1.4*   --   --  2.1 1.9  PHOS  --   --   --   --   --   --  1.4* 1.2*   < > = values in this interval not displayed.   Liver Function Tests: Recent Labs  Lab 10/02/20 1901 10/03/20 0452  AST 35 42*  ALT 20 20  ALKPHOS 123 119  BILITOT 2.3* 1.6*  PROT 6.5 5.2*  ALBUMIN 3.5 2.6*   Recent Labs  Lab 10/02/20 1901  LIPASE 48   No results for input(s): AMMONIA in the last 168 hours. CBC: Recent Labs  Lab 10/02/20 1901 10/03/20 0452 10/04/20 0314 10/05/20 0627 10/06/20 0458  WBC 15.3* 23.9* 24.1* 20.6* 17.6*  NEUTROABS 12.3* 21.3*  --   --   --   HGB 14.3 12.1 11.1* 10.9* 10.6*  HCT 39.9 34.2* 30.7* 32.0* 31.2*  MCV 93.7 95.5 94.5 98.2 99.0  PLT 273 210 180 198 205   Cardiac Enzymes: No results for input(s): CKTOTAL, CKMB, CKMBINDEX, TROPONINI in the last 168 hours. BNP: Invalid input(s): POCBNP CBG: No results for input(s): GLUCAP in the last 168 hours. D-Dimer No results for input(s): DDIMER in the last 72 hours. Hgb A1c No results for input(s): HGBA1C in the last 72 hours. Lipid Profile No results for input(s): CHOL, HDL, LDLCALC, TRIG, CHOLHDL, LDLDIRECT in the last 72 hours. Thyroid function studies No results for input(s): TSH, T4TOTAL, T3FREE, THYROIDAB in the last 72 hours.  Invalid input(s): FREET3 Anemia work up No results for input(s): VITAMINB12, FOLATE, FERRITIN, TIBC, IRON, RETICCTPCT in the last 72 hours. Urinalysis    Component Value Date/Time   COLORURINE YELLOW (A) 09/10/2020 1339   APPEARANCEUR HAZY (A) 09/10/2020 1339   LABSPEC 1.011 09/10/2020 1339   PHURINE 5.0 09/10/2020 1339   GLUCOSEU NEGATIVE 09/10/2020 1339   HGBUR NEGATIVE 09/10/2020 1339   BILIRUBINUR NEGATIVE 09/10/2020 1339   KETONESUR NEGATIVE 09/10/2020 1339   PROTEINUR NEGATIVE 09/10/2020 1339   NITRITE NEGATIVE 09/10/2020 1339   LEUKOCYTESUR NEGATIVE 09/10/2020 1339   Sepsis Labs Invalid input(s): PROCALCITONIN,  WBC,  LACTICIDVEN Microbiology Recent Results (from the past  240 hour(s))  SARS CORONAVIRUS 2 (TAT 6-24 HRS) Nasopharyngeal Nasopharyngeal Swab     Status: None   Collection Time: 10/01/20 11:10 AM   Specimen: Nasopharyngeal Swab  Result Value Ref Range Status   SARS Coronavirus 2 NEGATIVE NEGATIVE Final    Comment: (NOTE) SARS-CoV-2 target nucleic acids are NOT DETECTED.  The SARS-CoV-2 RNA is generally detectable in upper and lower respiratory  specimens during the acute phase of infection. Negative results do not preclude SARS-CoV-2 infection, do not rule out co-infections with other pathogens, and should not be used as the sole basis for treatment or other patient management decisions. Negative results must be combined with clinical observations, patient history, and epidemiological information. The expected result is Negative.  Fact Sheet for Patients: SugarRoll.be  Fact Sheet for Healthcare Providers: https://www.woods-mathews.com/  This test is not yet approved or cleared by the [place] FDA and  has been authorized for detection and/or diagnosis of SARS-CoV-2 by FDA under an Emergency Use Authorization (EUA). This EUA will remain  in effect (meaning this test can be used) for the duration of the COVID-19 declaration under Se ction 564(b)(1) of the Act, 21 U.S.C. section 360bbb-3(b)(1), unless the authorization is terminated or revoked sooner.  Performed at [facility], [place] [address] [place], [place] [address]   Blood culture (single)     Status: Abnormal   Collection Time: 10/02/20 10:11 PM   Specimen: BLOOD  Result Value Ref Range Status   Specimen Description   Final    BLOOD LEFT ANTECUBITAL Performed at [facility], [address] [place], [place] [address]    Special Requests   Final    BOTTLES DRAWN AEROBIC AND ANAEROBIC Blood Culture results may not be optimal due to an excessive volume of blood received in culture bottles Performed at [facility], [address] [place], [place] [address]    Culture  Setup Time   Final    Organism ID to follow [place] CRITICAL RESULT CALLED TO, READ BACK BY AND VERIFIED WITH: [place], [place] 10/03/2020 DB Performed at [facility], [address] [place], [place] [address]    Culture (A)  Final    STAPHYLOCOCCUS EPIDERMIDIS THE SIGNIFICANCE OF ISOLATING THIS ORGANISM FROM A SINGLE SET OF BLOOD CULTURES WHEN MULTIPLE SETS ARE DRAWN IS UNCERTAIN. PLEASE NOTIFY THE MICROBIOLOGY DEPARTMENT WITHIN ONE WEEK IF SPECIATION AND SENSITIVITIES ARE REQUIRED. Performed at [facility], [place] [address], [place],  [address]    Report Status 10/05/2020 FINAL  Final  Blood Culture ID Panel (Reflexed)     Status: Abnormal   Collection Time: 10/02/20 10:11 PM  Result Value Ref Range Status   Enterococcus faecalis NOT DETECTED NOT DETECTED Final   Enterococcus Faecium NOT DETECTED NOT DETECTED Final   Listeria monocytogenes NOT DETECTED NOT DETECTED Final   Staphylococcus species DETECTED (A) NOT DETECTED Final    Comment: CRITICAL RESULT CALLED TO, READ BACK BY AND VERIFIED WITH: [place], PHAR 2001 10/03/2020 DB    Staphylococcus aureus (BCID) NOT DETECTED NOT DETECTED Final   Staphylococcus epidermidis DETECTED (A) NOT DETECTED Final    Comment: CRITICAL RESULT CALLED TO, READ BACK BY AND VERIFIED WITH: CLARISSA DOLAN, PHAR 2001 10/03/2020 DB    Staphylococcus lugdunensis NOT DETECTED NOT DETECTED Final   Streptococcus species NOT DETECTED NOT DETECTED Final   Streptococcus agalactiae NOT DETECTED NOT DETECTED Final   Streptococcus pneumoniae NOT DETECTED NOT DETECTED Final   Streptococcus pyogenes NOT DETECTED NOT DETECTED Final   A.calcoaceticus-baumannii NOT DETECTED NOT DETECTED Final   Bacteroides fragilis NOT DETECTED NOT DETECTED Final   Enterobacterales NOT DETECTED NOT DETECTED Final   Enterobacter cloacae complex NOT  DETECTED NOT DETECTED Final   Escherichia coli NOT DETECTED NOT DETECTED Final   Klebsiella aerogenes NOT DETECTED NOT DETECTED Final   Klebsiella oxytoca NOT  DETECTED NOT DETECTED Final   Klebsiella pneumoniae NOT DETECTED NOT DETECTED Final   Proteus species NOT DETECTED NOT DETECTED Final   Salmonella species NOT DETECTED NOT DETECTED Final   Serratia marcescens NOT DETECTED NOT DETECTED Final   Haemophilus influenzae NOT DETECTED NOT DETECTED Final   Neisseria meningitidis NOT DETECTED NOT DETECTED Final   Pseudomonas aeruginosa NOT DETECTED NOT DETECTED Final   Stenotrophomonas maltophilia NOT DETECTED NOT DETECTED Final   Candida albicans NOT DETECTED NOT DETECTED Final   Candida auris NOT DETECTED NOT DETECTED Final   Candida glabrata NOT DETECTED NOT DETECTED Final   Candida krusei NOT DETECTED NOT DETECTED Final   Candida parapsilosis NOT DETECTED NOT DETECTED Final   Candida tropicalis NOT DETECTED NOT DETECTED Final   Cryptococcus neoformans/gattii NOT DETECTED NOT DETECTED Final   Methicillin resistance mecA/C NOT DETECTED NOT DETECTED Final    Comment: Performed at [facility], [address] [place], [place] [address]  Gastrointestinal Panel by PCR , Stool     Status: None   Collection Time: 10/03/20 10:00 AM   Specimen: Stool  Result Value Ref Range Status   Campylobacter species NOT DETECTED NOT DETECTED Final   Plesimonas shigelloides NOT DETECTED NOT DETECTED Final   Salmonella species NOT DETECTED NOT DETECTED Final   Yersinia enterocolitica NOT DETECTED NOT DETECTED Final   Vibrio species NOT DETECTED NOT DETECTED Final   Vibrio cholerae NOT DETECTED NOT DETECTED Final   Enteroaggregative E coli (EAEC) NOT DETECTED NOT DETECTED Final   Enteropathogenic E coli (EPEC) NOT DETECTED NOT DETECTED Final   Enterotoxigenic E coli (ETEC) NOT DETECTED NOT DETECTED Final   Shiga like toxin producing E coli (STEC) NOT DETECTED NOT DETECTED Final    Shigella/Enteroinvasive E coli (EIEC) NOT DETECTED NOT DETECTED Final   Cryptosporidium NOT DETECTED NOT DETECTED Final   Cyclospora cayetanensis NOT DETECTED NOT DETECTED Final   Entamoeba histolytica NOT DETECTED NOT DETECTED Final   Giardia lamblia NOT DETECTED NOT DETECTED Final   Adenovirus F40/41 NOT DETECTED NOT DETECTED Final   Astrovirus NOT DETECTED NOT DETECTED Final   Norovirus GI/GII NOT DETECTED NOT DETECTED Final   Rotavirus A NOT DETECTED NOT DETECTED Final   Sapovirus (I, II, IV, and V) NOT DETECTED NOT DETECTED Final    Comment: Performed at [facility], [place], [place] [address]  C Difficile Quick Screen w PCR reflex     Status: None   Collection Time: 10/03/20 10:00 AM   Specimen: Stool  Result Value Ref Range Status   C Diff antigen NEGATIVE NEGATIVE Final   C Diff toxin NEGATIVE NEGATIVE Final   C Diff interpretation No C. difficile detected.  Final    Comment: Performed at [facility], [address] [place], [place] [address]  Culture, blood (Routine X 2) w Reflex to ID Panel     Status: None (Preliminary result)   Collection Time: 10/03/20 12:54 PM   Specimen: BLOOD  Result Value Ref Range Status   Specimen Description BLOOD RIGHT ANTECUBITAL  Final   Special Requests   Final    BOTTLES DRAWN AEROBIC AND ANAEROBIC Blood Culture adequate volume   Culture  Setup Time PENDING  Incomplete   Culture   Final    NO GROWTH 3 DAYS Performed at [facility], [address], [place], [place] [address]    Report Status PENDING  Incomplete     Total time spend on discharging this patient, including the  last patient exam, discussing the hospital stay, instructions for ongoing care as it relates to all pertinent caregivers, as well as preparing the medical discharge records, prescriptions, and/or referrals as applicable, is 50 minutes.    Enzo Bi, MD  [facility] 10/06/2020, 9:35 AM

## 2020-10-06 NOTE — Progress Notes (Incomplete)
PROGRESS NOTE    Megan Becker  [id number] DOB: January 15, 1935 DOA: 10/02/2020 PCP: [facility]:   Active Problems:   Atrial fibrillation (HCC)   CAD (coronary artery disease)   Chronic heart failure with preserved ejection fraction (HCC)   Hypertension   S/P MVR (mitral valve replacement)   Lung metastasis (HCC)   Hyponatremia   Hypokalemia   Pancreatic mass on CT   Generalized weakness   Chronic anticoagulation   AKI (acute kidney injury) ([place])   Leukocytosis   Sepsis (HCC)   Lactic acidosis   Cancer [facility])   Palliative care encounter   HPI:  Megan Becker is a 86 y.o.  female who presented to the ED as a code STEMI, which was ruled out by cardiologist Dr.End. Pt reports a 1 month history of generalized weakness, poor appetite, and poor P.O. intake. Pt also reports 1 day history of nonbloody, nonbilious vomiting episodes without coffee grounds and several episodes of diarrhea. Pt complains of intermittent, mild, and dull back pain rated a 10/10 on pain scale, which radiates to the left shoulder. Pt has a PMH significant for CAD, mechanical heart valve secondary to rheumatic heart disease on Coumadin, chronic diastolic HF, A.fib, HTN, and hyperlipidemia.    Encounter for hospice care -patient's cancer is likely Stage IV. Patient was advised that biopsy likely will not benefit treatment plan or extend life. Plan: -discharge to home for [facility] care with [facility]  Lung metastasis ([place]) Pancreatic mass on CT -imaging showing extensive lung metastases with possible pancreatic primary -patient and family denied biopsy post consult with oncology, Dr.Rao. Cancer is likely Stage IV. Plan: -family will take patient home today for at home hospice services   Hyponatremia -Na 124 on presentation, improved with NS infusion, however, stabilized at 133 today. Plan: - d/c NS for discharge. Encourage P.O. intake of food and oral  hydration.  Hypokalemia -presented with hypokalemia on admission and has received IV potassium for supplement. -potassium stabilized today at 4.3. Plan: -d/c IV potassium and encourage oral intake of potassium rich foods.  SIRS -Patient with vomiting and diarrhea, tachycardic and tachypneic, WBC 15,000 with lactic acid 3.2, No clear source of infection currently.   --CT chest didn't show consolidation and pt is not hypoxic, though pt was started on empiric PNA treatment with ceftriaxone and azithromycin -With GI symptoms, could be gastroenteritis  --WBC trending down since two days ago. 24.1 >> 20.6 >> 17.6. --procalcitonin 0.24 on presentation, increased to 3.49 yesterday despite being on abx Plan: --d/c antibiotics due to home with hospice care  Lactic acidosis --3.2 on presentation, improved to 2.9 with IVF -kidney function improving, Cr is 0.73 Plan: -d/c IVF for hospice care   Atrial fibrillation ([place]) -Rate controlled.  Plan: -d/c metoprolol for hospice. Pharmacy managing warfarin due increased INR  CAD (coronary artery disease) -Troponin slightly elevated at18->23, not significant. -Patient initially presented as a code STEMI but ruled out by cardiology  Chronic heart failure with preserved ejection fraction (HCC) -BNP elevated above 200 -Patient mostly hypovolemic  Hx ofHypertension -BP intermittently low. Risk of symptomatic hypotension nearing end of life. Plan: -d/c home metoprolol and amlodipine  S/P MVR (mitral valve replacement) on warfarin -INR increase today at 6.0 from baseline at 3.0 1 year ago Plan: -Pharmacy for warfarin management  AKI (acute kidney injury) ([place]) -Creatinine currently 0.73, decreased from 1.2 2 days ago -Prerenal related to fluid losses from diarrhea on admission -Fluid  losses resolved Plan: --d/c NS for hospice    DVT prophylaxis: Pt is on Coumadin Code Status: DNR Family Communication: husband updated at  bedside Status is: inpatient Dispo:   The patient is from: home Anticipated d/c is to: home with hospice Anticipated d/c date is: today Patient currently is medically stable for discharge. Pt will [facility] with [facility].   Objective: Vitals:   10/05/20 1542 10/05/20 2141 10/06/20 0006 10/06/20 0412  BP: 138/76 (!) 128/47 109/61 (!) 147/80  Pulse: 98 (!) 107 98 98  Resp: 18 17 16 17   Temp: 98.6 F (37 C) 98.2 F (36.8 C) 98 F (36.7 C) 98.3 F (36.8 C)  TempSrc:   Oral   SpO2: 100% 99% 98% 99%  Height:        Intake/Output Summary (Last 24 hours) at 10/06/2020 0744 Last data filed at 10/06/2020 0400 Gross per 24 hour  Intake -  Output 500 ml  Net -500 ml   There were no vitals filed for this visit.  Examination:   Constitutional: NAD, AAOx3 HEENT: conjunctivae and lids normal, EOMI CV: No cyanosis.   RESP: increased respiratory effort, on nasal cannula 2L O2 per min  Extremities: No effusions, edema in BLE SKIN: warm, dry and intact. Extensive superficial bruising on abdomen. Neuro: II - XII grossly intact.   Psych: depressed mood and affect.   Data Reviewed: I have personally reviewed following labs and imaging studies  CBC: Recent Labs  Lab 10/02/20 1901 10/03/20 0452 10/04/20 0314 10/05/20 0627 10/06/20 0458  WBC 15.3* 23.9* 24.1* 20.6* 17.6*  NEUTROABS 12.3* 21.3*  --   --   --   HGB 14.3 12.1 11.1* 10.9* 10.6*  HCT 39.9 34.2* 30.7* 32.0* 31.2*  MCV 93.7 95.5 94.5 98.2 99.0  PLT 273 210 180 198 [id number]   Basic Metabolic Panel: Recent Labs  Lab 10/02/20 1901 10/03/20 0452 10/03/20 1302 10/04/20 0314 10/04/20 0651 10/04/20 1217 10/05/20 0627 10/06/20 0458  NA 124* 126*   < > 129* 128* 128* 132* 133*  K 2.9* 2.7*  --  3.0*  --   --  3.4* 4.3  CL 81* 89*  --  94*  --   --  99 101  CO2 27 27  --  25  --   --  25 25  GLUCOSE 179* 123*  --  122*  --   --  115* 121*  BUN 16 18  --  18  --   --  15 15  CREATININE 1.65* 1.76*  --   1.20*  --   --  0.85 0.73  CALCIUM 8.3* 7.3*  --  6.8*  --   --  7.2* 7.2*  MG  --   --   --  1.4*  --   --  2.1 1.9  PHOS  --   --   --   --   --   --  1.4* 1.2*   < > = values in this interval not displayed.   GFR: CrCl cannot be calculated (Unknown ideal weight.). Liver Function Tests: Recent Labs  Lab 10/02/20 1901 10/03/20 0452  AST 35 42*  ALT 20 20  ALKPHOS 123 119  BILITOT 2.3* 1.6*  PROT 6.5 5.2*  ALBUMIN 3.5 2.6*   Recent Labs  Lab 10/02/20 1901  LIPASE 48   No results for input(s): AMMONIA in the last 168 hours. Coagulation Profile: Recent Labs  Lab 10/02/20 1901 10/03/20 [id number] 10/04/20 0314 10/05/20 [id number]  10/06/20 0458  INR 2.3* 2.8* 3.3* 4.5* 6.0*   Cardiac Enzymes: No results for input(s): CKTOTAL, CKMB, CKMBINDEX, TROPONINI in the last 168 hours. BNP (last 3 results) No results for input(s): PROBNP in the last 8760 hours. HbA1C: No results for input(s): HGBA1C in the last 72 hours. CBG: No results for input(s): GLUCAP in the last 168 hours. Lipid Profile: No results for input(s): CHOL, HDL, LDLCALC, TRIG, CHOLHDL, LDLDIRECT in the last 72 hours. Thyroid Function Tests: No results for input(s): TSH, T4TOTAL, FREET4, T3FREE, THYROIDAB in the last 72 hours. Anemia Panel: No results for input(s): VITAMINB12, FOLATE, FERRITIN, TIBC, IRON, RETICCTPCT in the last 72 hours. Sepsis Labs: Recent Labs  Lab 10/02/20 2017 10/02/20 2159 10/03/20 0452 10/04/20 1217  PROCALCITON 0.24  --   --  3.49  LATICACIDVEN  --  3.2* 2.9*  --     Recent Results (from the past 240 hour(s))  SARS CORONAVIRUS 2 (TAT 6-24 HRS) Nasopharyngeal Nasopharyngeal Swab     Status: None   Collection Time: 10/01/20 11:10 AM   Specimen: Nasopharyngeal Swab  Result Value Ref Range Status   SARS Coronavirus 2 NEGATIVE NEGATIVE Final    Comment: (NOTE) SARS-CoV-2 target nucleic acids are NOT DETECTED.  The SARS-CoV-2 RNA is generally detectable in upper and lower respiratory  specimens during the acute phase of infection. Negative results do not preclude SARS-CoV-2 infection, do not rule out co-infections with other pathogens, and should not be used as the sole basis for treatment or other patient management decisions. Negative results must be combined with clinical observations, patient history, and epidemiological information. The expected result is Negative.  Fact Sheet for Patients: SugarRoll.be  Fact Sheet for Healthcare Providers: https://www.woods-mathews.com/  This test is not yet approved or cleared by the [place] FDA and  has been authorized for detection and/or diagnosis of SARS-CoV-2 by FDA under an Emergency Use Authorization (EUA). This EUA will remain  in effect (meaning this test can be used) for the duration of the COVID-19 declaration under Se ction 564(b)(1) of the Act, 21 U.S.C. section 360bbb-3(b)(1), unless the authorization is terminated or revoked sooner.  Performed at [facility], [place] [address], [place], [place] [address]   Blood culture (single)     Status: Abnormal   Collection Time: 10/02/20 10:11 PM   Specimen: BLOOD  Result Value Ref Range Status   Specimen Description   Final    BLOOD LEFT ANTECUBITAL Performed at [facility], [address] [place], [place] [address]    Special Requests   Final    BOTTLES DRAWN AEROBIC AND ANAEROBIC Blood Culture results may not be optimal due to an excessive volume of blood received in culture bottles Performed at [facility], [address] [place], [place] [address]    Culture  Setup Time   Final    Organism ID to follow [place] CRITICAL RESULT CALLED TO, READ BACK BY AND VERIFIED WITH: [place], [place] 10/03/2020 DB Performed at [facility], [address] [place], [place] [address]    Culture (A)  Final    STAPHYLOCOCCUS EPIDERMIDIS THE  SIGNIFICANCE OF ISOLATING THIS ORGANISM FROM A SINGLE SET OF BLOOD CULTURES WHEN MULTIPLE SETS ARE DRAWN IS UNCERTAIN. PLEASE NOTIFY THE MICROBIOLOGY DEPARTMENT WITHIN ONE WEEK IF SPECIATION AND SENSITIVITIES ARE REQUIRED. Performed at [facility], [place] [address], [place], [place] [address]    Report Status 10/05/2020 FINAL  Final  Blood  Culture ID Panel (Reflexed)     Status: Abnormal   Collection Time: 10/02/20 10:11 PM  Result Value Ref Range Status   Enterococcus faecalis NOT DETECTED NOT DETECTED Final   Enterococcus Faecium NOT DETECTED NOT DETECTED Final   Listeria monocytogenes NOT DETECTED NOT DETECTED Final   Staphylococcus species DETECTED (A) NOT DETECTED Final    Comment: CRITICAL RESULT CALLED TO, READ BACK BY AND VERIFIED WITH: CLARISSA DOLAN, PHAR 2001 10/03/2020 DB    Staphylococcus aureus (BCID) NOT DETECTED NOT DETECTED Final   Staphylococcus epidermidis DETECTED (A) NOT DETECTED Final    Comment: CRITICAL RESULT CALLED TO, READ BACK BY AND VERIFIED WITH: CLARISSA DOLAN, PHAR 2001 10/03/2020 DB    Staphylococcus lugdunensis NOT DETECTED NOT DETECTED Final   Streptococcus species NOT DETECTED NOT DETECTED Final   Streptococcus agalactiae NOT DETECTED NOT DETECTED Final   Streptococcus pneumoniae NOT DETECTED NOT DETECTED Final   Streptococcus pyogenes NOT DETECTED NOT DETECTED Final   A.calcoaceticus-baumannii NOT DETECTED NOT DETECTED Final   Bacteroides fragilis NOT DETECTED NOT DETECTED Final   Enterobacterales NOT DETECTED NOT DETECTED Final   Enterobacter cloacae complex NOT DETECTED NOT DETECTED Final   Escherichia coli NOT DETECTED NOT DETECTED Final   Klebsiella aerogenes NOT DETECTED NOT DETECTED Final   Klebsiella oxytoca NOT DETECTED NOT DETECTED Final   Klebsiella pneumoniae NOT DETECTED NOT DETECTED Final   Proteus species NOT DETECTED NOT DETECTED Final   Salmonella species NOT DETECTED NOT DETECTED Final   Serratia marcescens NOT DETECTED NOT  DETECTED Final   Haemophilus influenzae NOT DETECTED NOT DETECTED Final   Neisseria meningitidis NOT DETECTED NOT DETECTED Final   Pseudomonas aeruginosa NOT DETECTED NOT DETECTED Final   Stenotrophomonas maltophilia NOT DETECTED NOT DETECTED Final   Candida albicans NOT DETECTED NOT DETECTED Final   Candida auris NOT DETECTED NOT DETECTED Final   Candida glabrata NOT DETECTED NOT DETECTED Final   Candida krusei NOT DETECTED NOT DETECTED Final   Candida parapsilosis NOT DETECTED NOT DETECTED Final   Candida tropicalis NOT DETECTED NOT DETECTED Final   Cryptococcus neoformans/gattii NOT DETECTED NOT DETECTED Final   Methicillin resistance mecA/C NOT DETECTED NOT DETECTED Final    Comment: Performed at [facility], [address] [place], [place] [address]  Gastrointestinal Panel by PCR , Stool     Status: None   Collection Time: 10/03/20 10:00 AM   Specimen: Stool  Result Value Ref Range Status   Campylobacter species NOT DETECTED NOT DETECTED Final   Plesimonas shigelloides NOT DETECTED NOT DETECTED Final   Salmonella species NOT DETECTED NOT DETECTED Final   Yersinia enterocolitica NOT DETECTED NOT DETECTED Final   Vibrio species NOT DETECTED NOT DETECTED Final   Vibrio cholerae NOT DETECTED NOT DETECTED Final   Enteroaggregative E coli (EAEC) NOT DETECTED NOT DETECTED Final   Enteropathogenic E coli (EPEC) NOT DETECTED NOT DETECTED Final   Enterotoxigenic E coli (ETEC) NOT DETECTED NOT DETECTED Final   Shiga like toxin producing E coli (STEC) NOT DETECTED NOT DETECTED Final   Shigella/Enteroinvasive E coli (EIEC) NOT DETECTED NOT DETECTED Final   Cryptosporidium NOT DETECTED NOT DETECTED Final   Cyclospora cayetanensis NOT DETECTED NOT DETECTED Final   Entamoeba histolytica NOT DETECTED NOT DETECTED Final   Giardia lamblia NOT DETECTED NOT DETECTED Final   Adenovirus F40/41 NOT DETECTED NOT DETECTED Final   Astrovirus NOT DETECTED NOT DETECTED Final   Norovirus GI/GII  NOT DETECTED NOT DETECTED Final   Rotavirus A NOT DETECTED NOT  DETECTED Final   Sapovirus (I, II, IV, and V) NOT DETECTED NOT DETECTED Final    Comment: Performed at [facility], [place], [place] [address]  C Difficile Quick Screen w PCR reflex     Status: None   Collection Time: 10/03/20 10:00 AM   Specimen: Stool  Result Value Ref Range Status   C Diff antigen NEGATIVE NEGATIVE Final   C Diff toxin NEGATIVE NEGATIVE Final   C Diff interpretation No C. difficile detected.  Final    Comment: Performed at [facility], [address] [place], [place] [address]  Culture, blood (Routine X 2) w Reflex to ID Panel     Status: None (Preliminary result)   Collection Time: 10/03/20 12:54 PM   Specimen: BLOOD  Result Value Ref Range Status   Specimen Description BLOOD RIGHT ANTECUBITAL  Final   Special Requests   Final    BOTTLES DRAWN AEROBIC AND ANAEROBIC Blood Culture adequate volume   Culture  Setup Time PENDING  Incomplete   Culture   Final    NO GROWTH 3 DAYS Performed at [facility], [address] [place], [place] [address]    Report Status PENDING  Incomplete      Radiology Studies: No results found.   Scheduled Meds: . aspirin EC  81 mg Oral Daily  . atorvastatin  10 mg Oral QPM  . azithromycin  500 mg Oral Daily  . metoprolol tartrate  100 mg Oral BID  . pantoprazole  40 mg Oral BID  . potassium & sodium phosphates  1 packet Oral TID WC & HS  . Warfarin - Pharmacist Dosing Inpatient   Does not apply q1600   Continuous Infusions: . sodium chloride 50 mL/hr at 10/05/20 2322  . cefTRIAXone (ROCEPHIN)  IV 2 g (10/05/20 2327)  . sodium chloride       LOS: 4 days      [place], PA-S [facility] If 7PM-7AM, please contact night-coverage 10/06/2020, 7:44 AM

## 2020-10-08 ENCOUNTER — Telehealth: Payer: Self-pay | Admitting: Hospice and Palliative Medicine

## 2020-10-08 NOTE — Telephone Encounter (Signed)
I spoke with patient's hospice nurse.  They attempted to check INR with portable machine but value was unreadable.  Presumably INR is still elevated and past the detection limit of the machine.  We will have them check INR via lab draw.  Patient to continue holding Coumadin for now.  Case discussed with Dr. Janese Banks

## 2020-10-10 ENCOUNTER — Telehealth: Payer: Self-pay | Admitting: Hospice and Palliative Medicine

## 2020-10-10 NOTE — Telephone Encounter (Signed)
Received a call from patient's hospice nurse. They are faxing labs to the clinic. However, verbal report is that INR is greater than 10. No reported bleeding. Reportedly, patient continues to hold her warfarin. Called and discussed with Dr. Janese Banks. We will continue to hold warfarin and recheck INR on 10/12/2020. Orders called to triage RN. Patient instructed through hospice to call or utilize ER for bleeding. Fall precautions have also been thoroughly explored.

## 2020-10-11 LAB — CULTURE, BLOOD (ROUTINE X 2)
Culture: NO GROWTH
Special Requests: ADEQUATE

## 2020-10-13 ENCOUNTER — Telehealth: Payer: Self-pay | Admitting: Hospice and Palliative Medicine

## 2020-10-13 NOTE — Telephone Encounter (Signed)
I received a call back from hospice nurse. She spoke with daughter and confirmed that patient was taking warfarin 1mg  daily prior to hospitalization. Okay to resume previous regimen per Dr. Janese Banks. Again, plan is to recheck INR in 3 days.

## 2020-10-13 NOTE — Telephone Encounter (Signed)
I called hospice to request update on INR. Reportedly, INR received through lab was 10. Hospice NP was called and ordered vitamin k 5mg , which was administered orally in the home. INR today by portable machine was 2.1. Discussed with Dr. Janese Banks and orders given to resume previous warfarin regimen with plan for hospice to recheck INR on Tuesday 1/18 and to notify us with the results. Hospice nurse to review instructions with patient including notification/ER use if bleeding and fall prevention.

## 2020-10-15 ENCOUNTER — Encounter: Payer: Self-pay | Admitting: Family Medicine

## 2020-10-16 ENCOUNTER — Encounter: Payer: Self-pay | Admitting: Hospice and Palliative Medicine

## 2020-10-16 ENCOUNTER — Encounter: Payer: Medicare HMO | Admitting: Dermatology

## 2020-10-17 ENCOUNTER — Encounter: Payer: Self-pay | Admitting: Internal Medicine

## 2020-10-21 ENCOUNTER — Telehealth: Payer: Self-pay | Admitting: Hospice and Palliative Medicine

## 2020-10-22 ENCOUNTER — Encounter: Payer: Self-pay | Admitting: Family Medicine

## 2020-10-22 NOTE — Telephone Encounter (Signed)
I received a call from patient's hospice NP, Katie.  Hospice on-call was notified regarding critical INR greater than 10.  Joellen [place] says she discussed care with her covering MD -Dr. Antonieta Loveless who had recommended holding the warfarin and administering 2.5 mg of oral vitamin K.  Orders were already given to recheck INR in 3 days (Tuesday).  No bleeding or adverse effects reported.

## 2020-10-24 ENCOUNTER — Telehealth: Payer: Self-pay | Admitting: Hospice and Palliative Medicine

## 2020-10-24 NOTE — Telephone Encounter (Signed)
Received a call from patient's hospice nurse.  They went to the home yesterday to check PT/INR but were unable to get a blood draw after trying several times.  They were also unable to check it via fingerstick.  Nurse suspected that patient was clinically dry.  They are trying to push p.o. fluids and will try again.

## 2020-10-25 ENCOUNTER — Telehealth: Payer: Self-pay | Admitting: Hospice and Palliative Medicine

## 2020-10-25 MED ORDER — MORPHINE SULFATE (CONCENTRATE) 10 MG /0.5 ML PO SOLN: 5.0000 mg | ORAL | 0 refills | Status: AC | PRN

## 2020-10-25 NOTE — Telephone Encounter (Signed)
Received a call from patient's hospice nurse, Vivien Rota.  Nurse was again unable to obtain blood for INR sample.  Patient's oral intake is minimal.  They are trying to push oral fluids and will go back out tomorrow to attempt lab draw or fingerstick.  Nurse tells me that patient started back the Coumadin 1 mg daily dosing on Monday.  As this dosing caused patient to be hypertherapeutic, I suggested that patient hold Coumadin until we are able to assess INR.   Nurse also requested prescription for morphine elixir for patient due to pain/dyspnea.  Will send.

## 2020-10-26 ENCOUNTER — Telehealth: Payer: Self-pay | Admitting: Hospice and Palliative Medicine

## 2020-10-26 NOTE — Telephone Encounter (Signed)
I received a call from patient's hospice nurse with critically high PT/INR. Discussed with Dr. Janese Banks who recommended holding Coumadin and rechecking INR in 1 week. Orders given to hospice nurse, Vivien Rota. I called and spoke with both patient's daughters. They verbalized agreement with this plan. No bleeding reported by family. Family tell me that patient is rapidly declining. She is mostly bedbound and has extremely poor oral intake (mostly sips/bites). No other distressing symptoms reported. Family continue to desire comfort and hospice at home.

## 2020-10-29 ENCOUNTER — Encounter: Payer: Self-pay | Admitting: Family Medicine

## 2020-11-01 ENCOUNTER — Telehealth: Payer: Self-pay | Admitting: Hospice and Palliative Medicine

## 2020-11-01 NOTE — Telephone Encounter (Signed)
I received a call from patient's hospice nurse.  Nurse went out to the home to make visit today due to family report of bleeding.  Apparently, family have noted small amount of bright red blood in pull-ups each time that patient is cleaned.  Bleeding started today.  Unclear if bleeding is vaginal or hematuria.  No other bleeding noted.  INR was attempted via portable machine but remained critically high.  Nurse obtained a PT/INR via venipuncture with order for stat lab.  Overall, patient is clinically declining and appears to be rapidly approaching end-of-life.  Oral intake is minimal.  Patient is now bedbound. Life expectancy likely only days.   Discussed with Dr. Janese Banks who recommended oral vitamin K 5 mg daily x3 days and to discontinue warfarin.  Discussed recommendations with RN who says that family are in agreement with that plan.  Family goal is to keep her home and comfortable.  They understand that patient could potentially be subtherapeutic with INR after vitamin K but there is no plan to recheck labs given goal of comfort/end-of-life care.

## 2020-11-06 ENCOUNTER — Telehealth: Payer: Self-pay | Admitting: Hospice and Palliative Medicine

## 2020-11-27 NOTE — Telephone Encounter (Signed)
I received a call from patient's hospice nurse, Vivien Rota. Patient is reportedly actively dying. She has terminal agitation and we discussed management including use of as needed Haldol, lorazepam, and morphine.

## 2020-11-27 DEATH — deceased

## 2021-06-11 IMAGING — DX DG CHEST 1V PORT
1 series · 1 of 1 positions shown · non-contrast
Comparison: None.

CLINICAL DATA: Back pain, weakness

EXAM:
PORTABLE CHEST 1 VIEW

[chest ap]
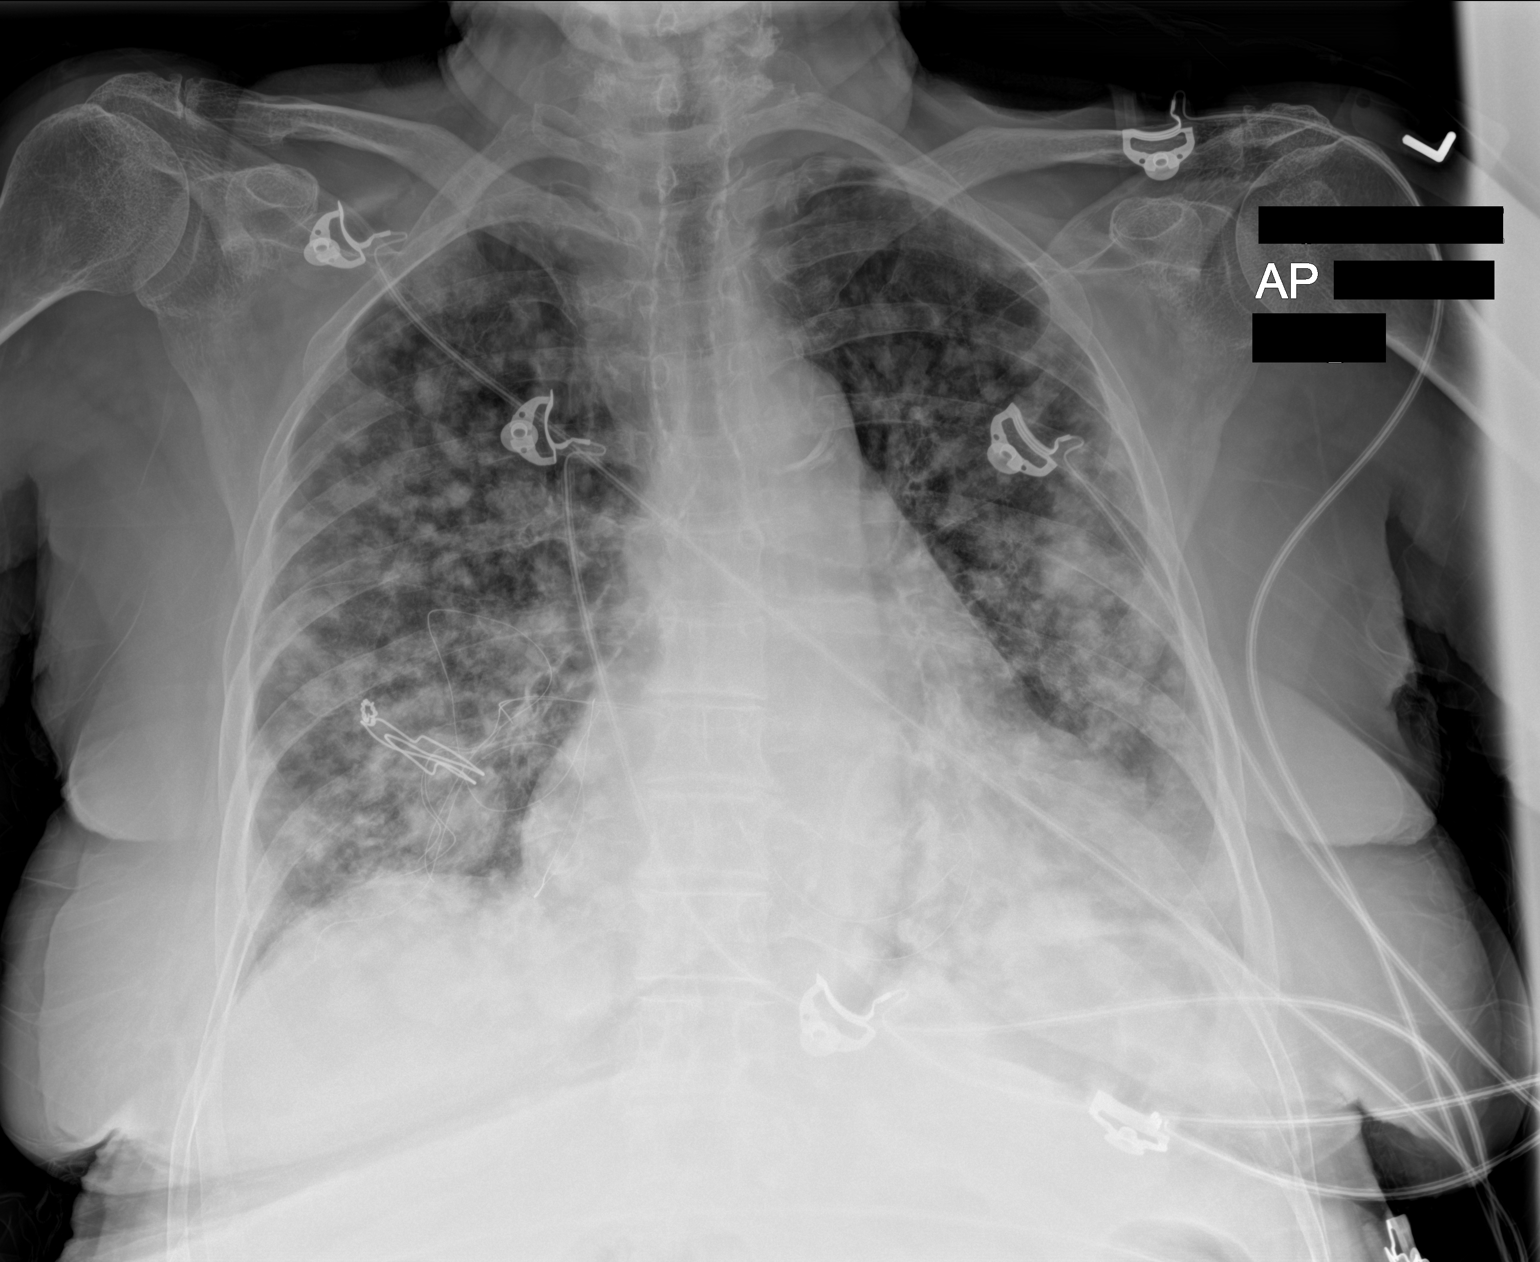

[1 of 1 positions shown; findings below may reference images not displayed]

FINDINGS: Diffuse bilateral nodular areas of consolidation. No significant
pleural effusion. No pneumothorax. Cardiomediastinal contours are
likely within normal limits for technique. Calcified plaque along
the thoracic aorta.
IMPRESSION: Diffuse bilateral nodular areas of consolidation. Chest CT
recommended for further evaluation.

## 2021-06-11 IMAGING — CT CT CHEST-ABD-PELV W/O CM
2 of 4 series · 14 of 36 positions shown, 16 images · non-contrast
Comparison: None.

CLINICAL DATA: Abdominal distension. Low back pain. Pain status
post fall.

EXAM:
CT CHEST, ABDOMEN AND PELVIS WITHOUT CONTRAST
TECHNIQUE: Multidetector CT imaging of the chest, abdomen and pelvis was
performed following the standard protocol without IV contrast.

[Series 2: cap wo st · axial · 0.84mm/px · z∈[-536,-26]mm · 11 of 124 slices shown, 13 images]
[im 11/124  mediastinal]
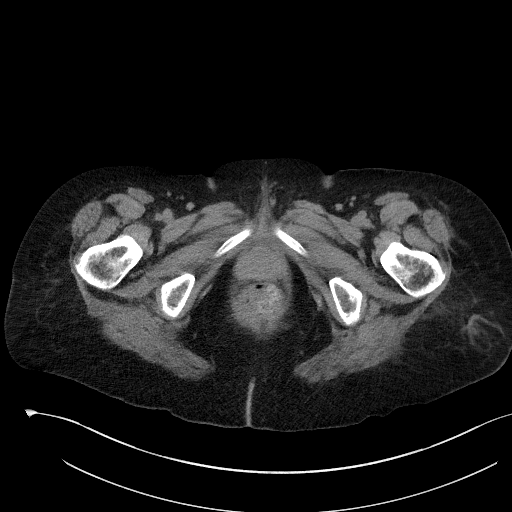
[im 11/124  bone]
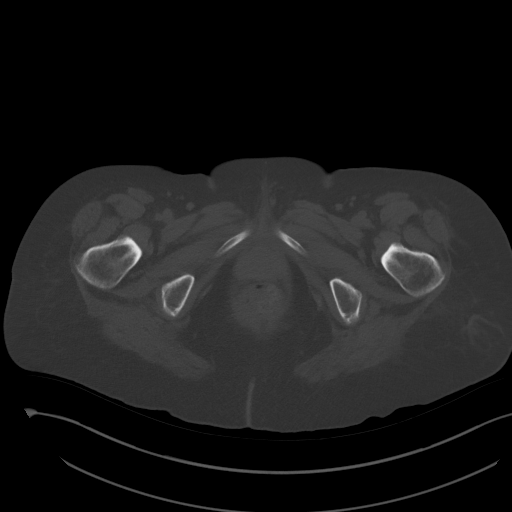
[im 21/124  mediastinal]
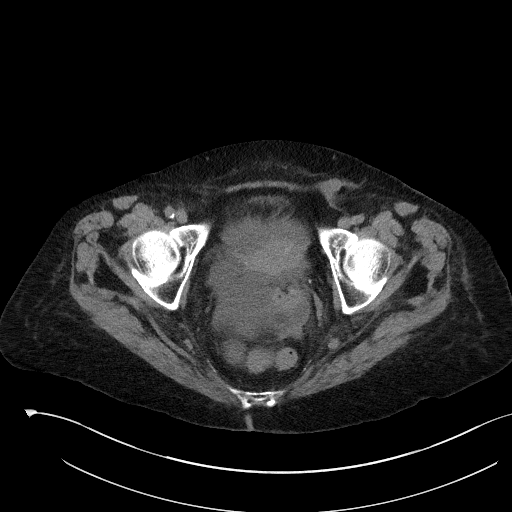
[im 31/124  mediastinal]
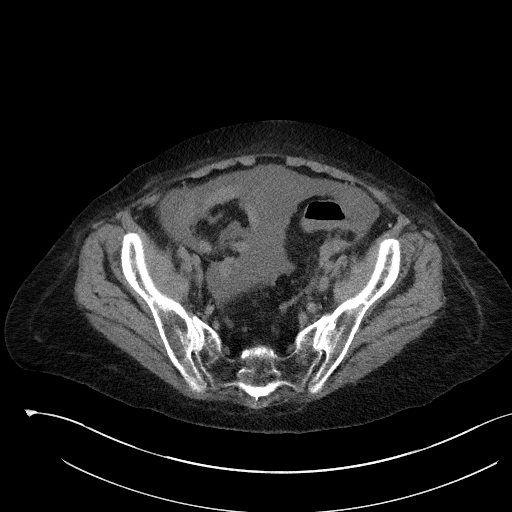
[im 42/124  mediastinal]
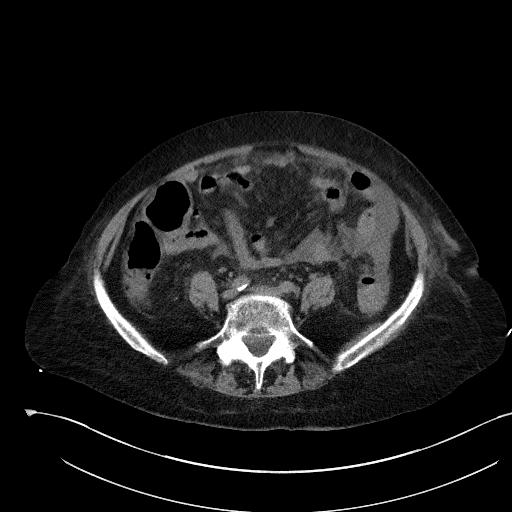
[im 52/124  mediastinal]
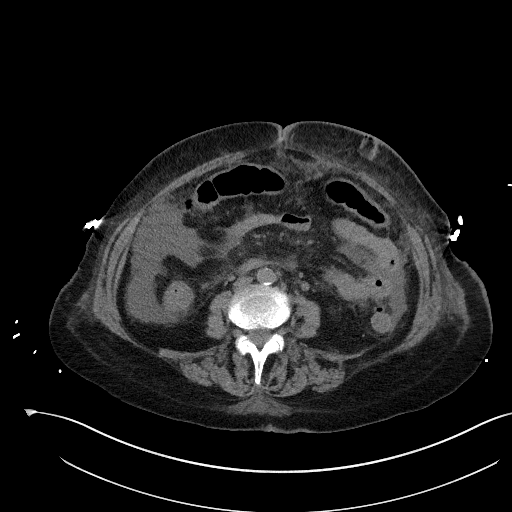
[im 62/124  mediastinal]
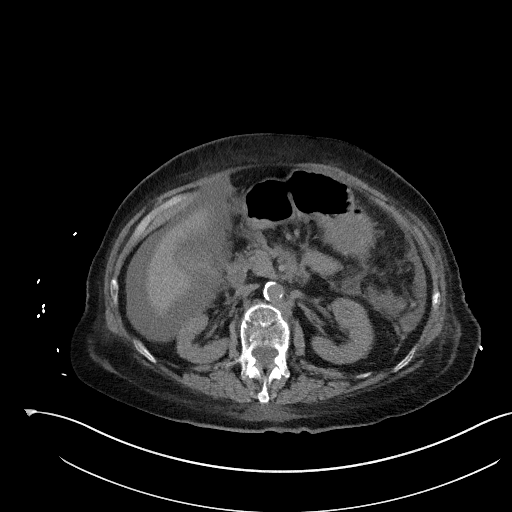
[im 72/124  mediastinal]
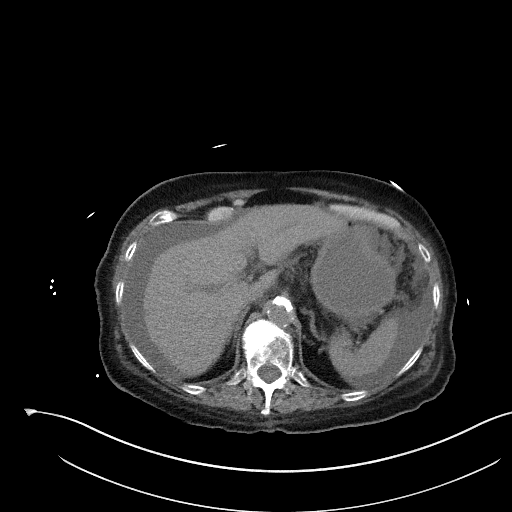
[im 83/124  mediastinal]
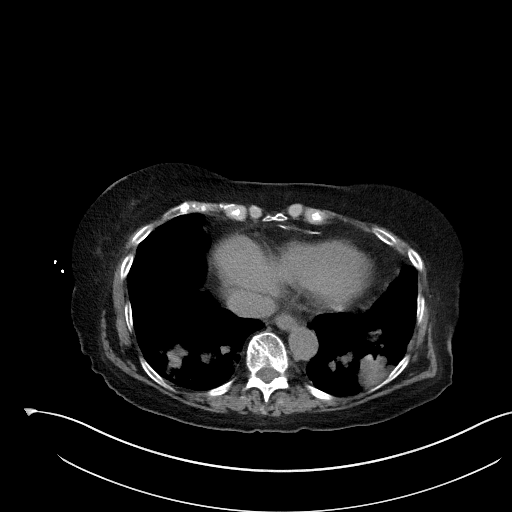
[im 93/124  mediastinal]
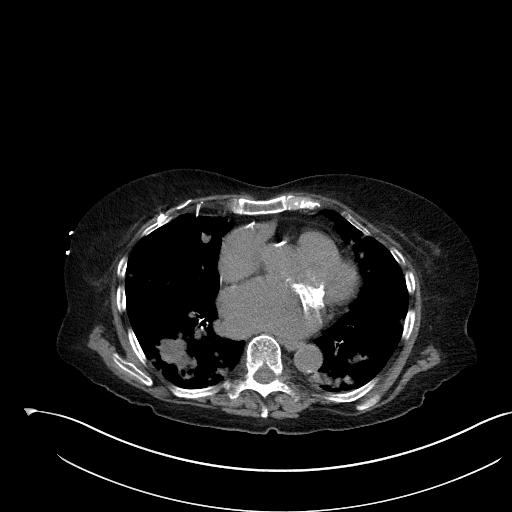
[im 93/124  bone]
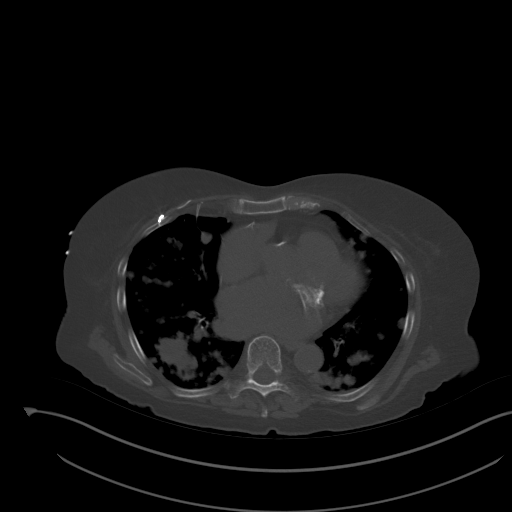
[im 103/124  mediastinal]
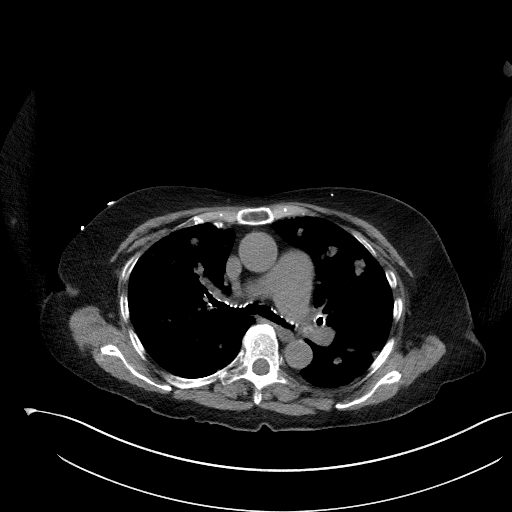
[im 113/124  mediastinal]
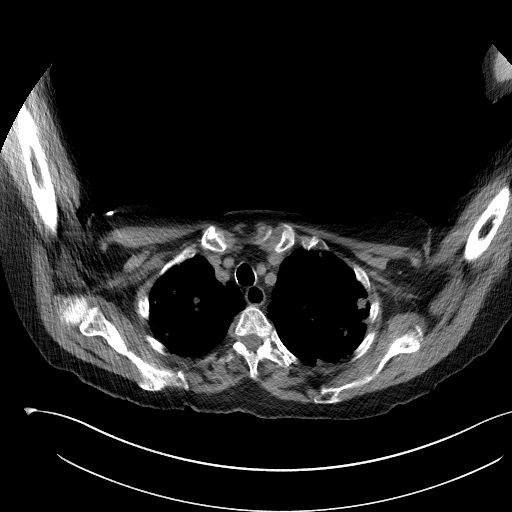

[Series 5: coronal · coronal · 0.77mm/px · 3 of 124 slices shown]
[im 25/124  mediastinal]
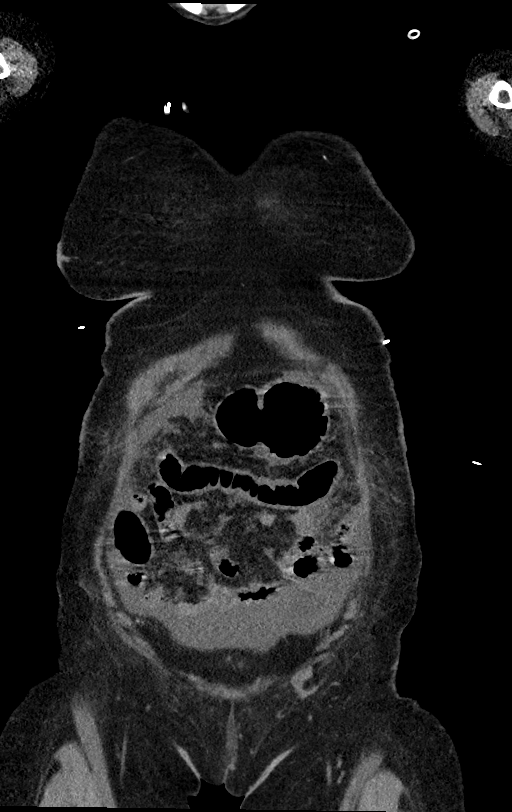
[im 50/124  mediastinal]
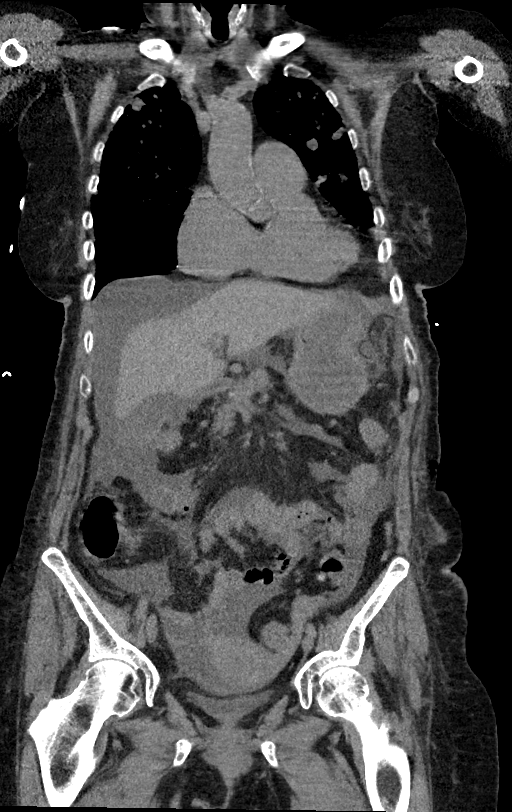
[im 74/124  mediastinal]
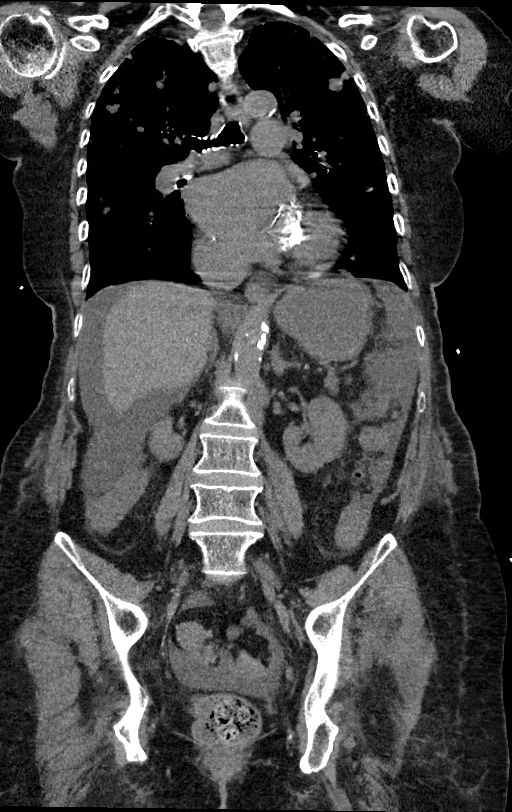

[14 of 36 positions shown; findings below may reference images not displayed]

FINDINGS: CT CHEST FINDINGS

Cardiovascular: The heart is enlarged. There are coronary artery
calcifications. There are atherosclerotic changes of the thoracic
aorta. There is no evidence for an aneurysm or large pleural
effusion.

Mediastinum/Nodes:

--there are pathologically enlarged mediastinal lymph nodes. For
example there is a 3.1 cm partially calcified lymph node in the AP
window (axial series 2, image 27).

-- No hilar lymphadenopathy.

-- No axillary lymphadenopathy.

-- No supraclavicular lymphadenopathy.

-- Normal thyroid gland where visualized.

-  Unremarkable esophagus.

Lungs/Pleura: There are innumerable pulmonary nodules throughout the
bilateral lung fields. The largest is located in the right lower
lobe and measures approximately 3.1 cm. There is no pneumothorax.
There is no large pleural effusion.

Musculoskeletal: There is no acute displaced fracture. There is a
sclerotic lesion involving the posterior T10 vertebral body.

CT ABDOMEN PELVIS FINDINGS

Hepatobiliary: The liver is somewhat cirrhotic in appearance. There
is layering hyperdense debris within the gallbladder which may
represent sludge or small stones.There is no biliary ductal
dilation.

Pancreas: There is a questionable masslike area at the level of the
pancreatic body. The distal pancreatic body and tail are somewhat
atrophic. This is not well evaluated in the absence of IV contrast
(axial series 2, image 58).

Spleen: Unremarkable.

Adrenals/Urinary Tract:

--Adrenal glands: Unremarkable.

--Right kidney/ureter: No hydronephrosis or radiopaque kidney
stones.

--Left kidney/ureter: No hydronephrosis or radiopaque kidney stones.

--Urinary bladder: Unremarkable.

Stomach/Bowel:

--Stomach/Duodenum: No hiatal hernia or other gastric abnormality.
Normal duodenal course and caliber.

--Small bowel: Unremarkable.

--Colon: There is scattered colonic diverticula without CT evidence
for diverticulitis. There is questionable mild wall thickening of
the ascending colon.

--Appendix: Normal.

Vascular/Lymphatic: Atherosclerotic calcification is present within
the non-aneurysmal abdominal aorta, without hemodynamically
significant stenosis.

--there is mild retroperitoneal adenopathy.

--No mesenteric lymphadenopathy.

--No pelvic or inguinal lymphadenopathy.

Reproductive: Unremarkable

Other: There is a small volume of abdominal ascites. Multiple
peritoneal implants and omental caking is noted.

Musculoskeletal. No acute displaced fractures. Multiple pockets of
subcutaneous gas are noted in the anterior abdominal wall, likely
related to subcutaneous injections.
IMPRESSION: 1. Findings are consistent with a metastatic process as evidence by
innumerable pulmonary nodules throughout both lung fields in
addition to findings concerning for omental caking and peritoneal
implants within the abdomen. The primary lesion is not clearly
identified, however there is a questionable masslike area involving
the pancreatic body that is not well characterized in the absence of
IV contrast.
2. Small volume ascites in the abdomen and pelvis.
3. Additional chronic findings as detailed above.
